# Patient Record
Sex: Female | Born: 1990 | Race: White | Hispanic: No | Marital: Married | Smoking: Current every day smoker
Health system: Southern US, Community
[De-identification: ages and names within clinical notes are randomized; demographics above are authoritative.]

## PROBLEM LIST (undated history)

## (undated) ENCOUNTER — Inpatient Hospital Stay: Payer: Self-pay

## (undated) ENCOUNTER — Emergency Department: Discharge status: Absent without leave | Payer: Medicaid Other

## (undated) ENCOUNTER — Inpatient Hospital Stay: Payer: Self-pay | Admitting: Maternal Newborn

## (undated) DIAGNOSIS — Z87442 Personal history of urinary calculi: Secondary | ICD-10-CM

## (undated) DIAGNOSIS — E119 Type 2 diabetes mellitus without complications: Secondary | ICD-10-CM

## (undated) DIAGNOSIS — J45909 Unspecified asthma, uncomplicated: Secondary | ICD-10-CM

## (undated) HISTORY — PX: NO PAST SURGERIES: SHX2092

## (undated) HISTORY — DX: Unspecified asthma, uncomplicated: J45.909

## (undated) HISTORY — DX: Type 2 diabetes mellitus without complications: E11.9

---
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
## 2012-10-04 ENCOUNTER — Ambulatory Visit: Payer: Self-pay | Admitting: Family Medicine

## 2012-10-04 LAB — PREGNANCY, URINE: Pregnancy Test, Urine: NEGATIVE m[IU]/mL

## 2017-01-23 ENCOUNTER — Encounter: Payer: Self-pay | Admitting: Advanced Practice Midwife

## 2017-01-23 ENCOUNTER — Other Ambulatory Visit: Payer: Self-pay

## 2017-01-23 ENCOUNTER — Ambulatory Visit (INDEPENDENT_AMBULATORY_CARE_PROVIDER_SITE_OTHER): Payer: Medicaid Other | Admitting: Advanced Practice Midwife

## 2017-01-23 VITALS — BP 128/78 | HR 80 | Ht 64.0 in | Wt 215.0 lb

## 2017-01-23 DIAGNOSIS — Z113 Encounter for screening for infections with a predominantly sexual mode of transmission: Secondary | ICD-10-CM

## 2017-01-23 DIAGNOSIS — Z131 Encounter for screening for diabetes mellitus: Secondary | ICD-10-CM

## 2017-01-23 DIAGNOSIS — Z3400 Encounter for supervision of normal first pregnancy, unspecified trimester: Secondary | ICD-10-CM

## 2017-01-23 DIAGNOSIS — O99210 Obesity complicating pregnancy, unspecified trimester: Secondary | ICD-10-CM

## 2017-01-23 DIAGNOSIS — O0990 Supervision of high risk pregnancy, unspecified, unspecified trimester: Secondary | ICD-10-CM | POA: Insufficient documentation

## 2017-01-23 DIAGNOSIS — Z6835 Body mass index (BMI) 35.0-35.9, adult: Secondary | ICD-10-CM

## 2017-01-23 NOTE — Patient Instructions (Signed)
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
## 2017-01-24 ENCOUNTER — Encounter: Payer: Self-pay | Admitting: Emergency Medicine

## 2017-01-24 ENCOUNTER — Inpatient Hospital Stay
Admission: EM | Admit: 2017-01-24 | Discharge: 2017-01-28 | DRG: 832 | Disposition: A | Discharge status: Home / Self Care | Payer: Medicaid Other | Attending: Obstetrics and Gynecology | Admitting: Obstetrics and Gynecology

## 2017-01-24 ENCOUNTER — Encounter: Payer: Self-pay | Admitting: Advanced Practice Midwife

## 2017-01-24 DIAGNOSIS — O30049 Twin pregnancy, dichorionic/diamniotic, unspecified trimester: Secondary | ICD-10-CM

## 2017-01-24 DIAGNOSIS — R1011 Right upper quadrant pain: Secondary | ICD-10-CM

## 2017-01-24 DIAGNOSIS — N10 Acute tubulo-interstitial nephritis: Secondary | ICD-10-CM

## 2017-01-24 DIAGNOSIS — O26891 Other specified pregnancy related conditions, first trimester: Secondary | ICD-10-CM

## 2017-01-24 DIAGNOSIS — N201 Calculus of ureter: Secondary | ICD-10-CM

## 2017-01-24 DIAGNOSIS — T83022A Displacement of nephrostomy catheter, initial encounter: Secondary | ICD-10-CM

## 2017-01-24 DIAGNOSIS — R109 Unspecified abdominal pain: Secondary | ICD-10-CM | POA: Diagnosis present

## 2017-01-24 DIAGNOSIS — F17210 Nicotine dependence, cigarettes, uncomplicated: Secondary | ICD-10-CM | POA: Diagnosis present

## 2017-01-24 DIAGNOSIS — Z803 Family history of malignant neoplasm of breast: Secondary | ICD-10-CM

## 2017-01-24 DIAGNOSIS — N136 Pyonephrosis: Secondary | ICD-10-CM | POA: Diagnosis present

## 2017-01-24 DIAGNOSIS — O30041 Twin pregnancy, dichorionic/diamniotic, first trimester: Secondary | ICD-10-CM | POA: Diagnosis present

## 2017-01-24 DIAGNOSIS — R1031 Right lower quadrant pain: Secondary | ICD-10-CM

## 2017-01-24 DIAGNOSIS — O2301 Infections of kidney in pregnancy, first trimester: Principal | ICD-10-CM | POA: Diagnosis present

## 2017-01-24 DIAGNOSIS — O99331 Smoking (tobacco) complicating pregnancy, first trimester: Secondary | ICD-10-CM | POA: Diagnosis present

## 2017-01-24 DIAGNOSIS — Z3A10 10 weeks gestation of pregnancy: Secondary | ICD-10-CM

## 2017-01-24 LAB — URINALYSIS, COMPLETE (UACMP) WITH MICROSCOPIC
Bilirubin Urine: NEGATIVE
Glucose, UA: NEGATIVE mg/dL
KETONES UR: NEGATIVE mg/dL
Nitrite: POSITIVE — AB
PROTEIN: 100 mg/dL — AB
Specific Gravity, Urine: 1.017 (ref 1.005–1.030)
pH: 5 (ref 5.0–8.0)

## 2017-01-24 LAB — COMPREHENSIVE METABOLIC PANEL
ALBUMIN: 3.9 g/dL (ref 3.5–5.0)
ALT: 23 U/L (ref 14–54)
ANION GAP: 10 (ref 5–15)
AST: 23 U/L (ref 15–41)
Alkaline Phosphatase: 68 U/L (ref 38–126)
BUN: 9 mg/dL (ref 6–20)
CHLORIDE: 104 mmol/L (ref 101–111)
CO2: 20 mmol/L — AB (ref 22–32)
Calcium: 9.4 mg/dL (ref 8.9–10.3)
Creatinine, Ser: 0.5 mg/dL (ref 0.44–1.00)
GFR calc non Af Amer: >60 mL/min (ref >60)
GLUCOSE: 106 mg/dL — AB (ref 65–99)
Potassium: 3.7 mmol/L (ref 3.5–5.1)
SODIUM: 134 mmol/L — AB (ref 135–145)
Total Bilirubin: 0.4 mg/dL (ref 0.3–1.2)
Total Protein: 7.5 g/dL (ref 6.5–8.1)

## 2017-01-24 LAB — CBC
HCT: 39.7 % (ref 35.0–47.0)
HEMOGLOBIN: 13.7 g/dL (ref 12.0–16.0)
MCH: 30.5 pg (ref 26.0–34.0)
MCHC: 34.5 g/dL (ref 32.0–36.0)
MCV: 88.4 fL (ref 80.0–100.0)
Platelets: 373 10*3/uL (ref 150–440)
RBC: 4.49 MIL/uL (ref 3.80–5.20)
RDW: 13.2 % (ref 11.5–14.5)
WBC: 15.2 10*3/uL — ABNORMAL HIGH (ref 3.6–11.0)

## 2017-01-24 LAB — LIPASE, BLOOD: LIPASE: 22 U/L (ref 11–51)

## 2017-01-24 LAB — HCG, QUANTITATIVE, PREGNANCY: HCG, BETA CHAIN, QUANT, S: 223491 m[IU]/mL — AB (ref <5)

## 2017-01-24 MED ORDER — PROMETHAZINE HCL 25 MG/ML IJ SOLN
12.5000 mg | Freq: Once | INTRAMUSCULAR | Status: AC
  Administered 2017-01-24: 12.5 mg via INTRAVENOUS

## 2017-01-24 MED ORDER — MORPHINE SULFATE (PF) 2 MG/ML IV SOLN
2.0000 mg | Freq: Once | INTRAVENOUS | Status: AC
  Administered 2017-01-24: 2 mg via INTRAVENOUS

## 2017-01-24 MED ORDER — MORPHINE SULFATE (PF) 2 MG/ML IV SOLN
INTRAVENOUS | Status: AC
Start: 2017-01-24 — End: 2017-01-25
  Filled 2017-01-24: qty 1

## 2017-01-24 MED ORDER — PROMETHAZINE HCL 25 MG/ML IJ SOLN
INTRAMUSCULAR | Status: AC
  Filled 2017-01-24: qty 1

## 2017-01-24 NOTE — ED Triage Notes (Signed)
Pt comes into the ED via POV c/o right RLQ abdominal pain that wraps around to her back.  Patient is [redacted] weeks pregnant.  Denies any vaginal bleeding at this time.  Abdominal pain started yesterday but today it has created nausea and increased pain.  Patient has even and unlabored respirations at this time and ambulatory to triage. Patient states she does have a h/o kidney stones.

## 2017-01-25 ENCOUNTER — Other Ambulatory Visit: Payer: Self-pay | Admitting: Maternal Newborn

## 2017-01-25 ENCOUNTER — Emergency Department: Payer: Medicaid Other

## 2017-01-25 ENCOUNTER — Observation Stay
Admit: 2017-01-25 | Discharge: 2017-01-25 | Disposition: A | Discharge status: Home / Self Care | Payer: Medicaid Other | Attending: Interventional Radiology | Admitting: Interventional Radiology

## 2017-01-25 ENCOUNTER — Observation Stay: Payer: Medicaid Other

## 2017-01-25 ENCOUNTER — Other Ambulatory Visit: Payer: Self-pay

## 2017-01-25 DIAGNOSIS — O30049 Twin pregnancy, dichorionic/diamniotic, unspecified trimester: Secondary | ICD-10-CM

## 2017-01-25 DIAGNOSIS — O2301 Infections of kidney in pregnancy, first trimester: Secondary | ICD-10-CM

## 2017-01-25 DIAGNOSIS — N201 Calculus of ureter: Secondary | ICD-10-CM

## 2017-01-25 DIAGNOSIS — O30041 Twin pregnancy, dichorionic/diamniotic, first trimester: Secondary | ICD-10-CM

## 2017-01-25 DIAGNOSIS — Z3A10 10 weeks gestation of pregnancy: Secondary | ICD-10-CM | POA: Diagnosis not present

## 2017-01-25 DIAGNOSIS — N136 Pyonephrosis: Secondary | ICD-10-CM

## 2017-01-25 HISTORY — PX: IR NEPHROSTOMY PLACEMENT RIGHT: IMG6064

## 2017-01-25 LAB — CHLAMYDIA/GONOCOCCUS/TRICHOMONAS, NAA
CHLAMYDIA BY NAA: NEGATIVE
Gonococcus by NAA: NEGATIVE
Trich vag by NAA: NEGATIVE

## 2017-01-25 LAB — URINE CULTURE: Organism ID, Bacteria: NO GROWTH

## 2017-01-25 MED ORDER — IOPAMIDOL (ISOVUE-300) INJECTION 61%
10.0000 mL | Freq: Once | INTRAVENOUS | Status: AC | PRN
  Administered 2017-01-25: 20 mL via INTRAVENOUS

## 2017-01-25 MED ORDER — PRENATAL MULTIVITAMIN CH
1.0000 | ORAL_TABLET | Freq: Every day | ORAL | Status: DC
  Filled 2017-01-25 (×3): qty 1

## 2017-01-25 MED ORDER — SODIUM CHLORIDE 0.9% FLUSH: 9.0000 mL | INTRAVENOUS | Status: DC | PRN

## 2017-01-25 MED ORDER — MORPHINE SULFATE (PF) 2 MG/ML IV SOLN
2.0000 mg | INTRAVENOUS | Status: DC | PRN
  Administered 2017-01-25 (×3): 2 mg via INTRAVENOUS
  Filled 2017-01-25 (×2): qty 1

## 2017-01-25 MED ORDER — FENTANYL CITRATE (PF) 100 MCG/2ML IJ SOLN
INTRAMUSCULAR | Status: AC
  Filled 2017-01-25: qty 2

## 2017-01-25 MED ORDER — ACETAMINOPHEN 325 MG PO TABS: 650.0000 mg | ORAL_TABLET | ORAL | Status: DC | PRN

## 2017-01-25 MED ORDER — MORPHINE SULFATE (PF) 2 MG/ML IV SOLN
2.0000 mg | Freq: Once | INTRAVENOUS | Status: AC
  Administered 2017-01-25: 2 mg via INTRAVENOUS
  Filled 2017-01-25: qty 1

## 2017-01-25 MED ORDER — DEXTROSE 5 % IV SOLN
1.0000 g | Freq: Two times a day (BID) | INTRAVENOUS | Status: DC
  Administered 2017-01-25 – 2017-01-26 (×2): 1 g via INTRAVENOUS
  Filled 2017-01-25 (×4): qty 10

## 2017-01-25 MED ORDER — NICOTINE 21 MG/24HR TD PT24
21.0000 mg | MEDICATED_PATCH | Freq: Every day | TRANSDERMAL | Status: DC
  Administered 2017-01-25: 21 mg via TRANSDERMAL
  Filled 2017-01-25 (×2): qty 1

## 2017-01-25 MED ORDER — CLINDAMYCIN PHOSPHATE 600 MG/50ML IV SOLN
600.0000 mg | Freq: Once | INTRAVENOUS | Status: AC
  Administered 2017-01-25: 600 mg via INTRAVENOUS
  Filled 2017-01-25: qty 50

## 2017-01-25 MED ORDER — ZOLPIDEM TARTRATE 5 MG PO TABS
5.0000 mg | ORAL_TABLET | Freq: Every evening | ORAL | Status: DC | PRN
  Administered 2017-01-25 – 2017-01-27 (×3): 5 mg via ORAL
  Filled 2017-01-25 (×3): qty 1

## 2017-01-25 MED ORDER — LIDOCAINE HCL (PF) 1 % IJ SOLN
INTRAMUSCULAR | Status: AC | PRN
  Administered 2017-01-25: 10 mL

## 2017-01-25 MED ORDER — ONDANSETRON HCL 4 MG/2ML IJ SOLN: 4.0000 mg | Freq: Four times a day (QID) | INTRAMUSCULAR | Status: DC | PRN

## 2017-01-25 MED ORDER — FENTANYL CITRATE (PF) 100 MCG/2ML IJ SOLN
25.0000 ug | Freq: Once | INTRAMUSCULAR | Status: AC
  Administered 2017-01-25: 25 ug via INTRAVENOUS

## 2017-01-25 MED ORDER — MORPHINE SULFATE (PF) 2 MG/ML IV SOLN
INTRAVENOUS | Status: AC
  Filled 2017-01-25: qty 1

## 2017-01-25 MED ORDER — SODIUM CHLORIDE 0.9 % IJ SOLN
INTRAMUSCULAR | Status: AC
  Filled 2017-01-25: qty 10

## 2017-01-25 MED ORDER — SODIUM CHLORIDE 0.9 % IV SOLN
INTRAVENOUS | Status: DC
  Administered 2017-01-25: 13:00:00 via INTRAVENOUS

## 2017-01-25 MED ORDER — DIPHENHYDRAMINE HCL 12.5 MG/5ML PO ELIX
12.5000 mg | ORAL_SOLUTION | Freq: Four times a day (QID) | ORAL | Status: DC | PRN
  Filled 2017-01-25: qty 5

## 2017-01-25 MED ORDER — MORPHINE SULFATE 2 MG/ML IV SOLN
INTRAVENOUS | Status: DC
  Administered 2017-01-25: 17:00:00 via INTRAVENOUS
  Administered 2017-01-26 (×3): 2 mg via INTRAVENOUS
  Administered 2017-01-26: 14 mg via INTRAVENOUS
  Administered 2017-01-26: 19 mg via INTRAVENOUS
  Filled 2017-01-25 (×2): qty 25

## 2017-01-25 MED ORDER — SODIUM CHLORIDE FLUSH 0.9 % IV SOLN
INTRAVENOUS | Status: AC
  Filled 2017-01-25: qty 20

## 2017-01-25 MED ORDER — NALOXONE HCL 0.4 MG/ML IJ SOLN: 0.4000 mg | INTRAMUSCULAR | Status: DC | PRN

## 2017-01-25 MED ORDER — CEFTRIAXONE SODIUM IN DEXTROSE 20 MG/ML IV SOLN
1.0000 g | Freq: Once | INTRAVENOUS | Status: AC
  Administered 2017-01-25: 1 g via INTRAVENOUS
  Filled 2017-01-25: qty 50

## 2017-01-25 MED ORDER — DIPHENHYDRAMINE HCL 50 MG/ML IJ SOLN
12.5000 mg | Freq: Four times a day (QID) | INTRAMUSCULAR | Status: DC | PRN
  Administered 2017-01-26 – 2017-01-27 (×4): 12.5 mg via INTRAVENOUS
  Filled 2017-01-25 (×4): qty 1

## 2017-01-25 MED ORDER — SODIUM CHLORIDE 0.9% FLUSH
5.0000 mL | Freq: Three times a day (TID) | INTRAVENOUS | Status: DC
  Administered 2017-01-25 – 2017-01-27 (×3): 5 mL via INTRAVENOUS

## 2017-01-25 MED ORDER — ACETAMINOPHEN 325 MG PO TABS
650.0000 mg | ORAL_TABLET | ORAL | Status: DC | PRN
  Administered 2017-01-27 – 2017-01-28 (×3): 650 mg via ORAL
  Filled 2017-01-25 (×4): qty 2

## 2017-01-25 MED ORDER — HYDROMORPHONE HCL 1 MG/ML IJ SOLN
1.0000 mg | Freq: Once | INTRAMUSCULAR | Status: AC
  Administered 2017-01-25: 1 mg via INTRAVENOUS
  Filled 2017-01-25: qty 1

## 2017-01-25 MED ORDER — FENTANYL CITRATE (PF) 100 MCG/2ML IJ SOLN
INTRAMUSCULAR | Status: AC | PRN
  Administered 2017-01-25 (×2): 12.5 ug via INTRAVENOUS
  Administered 2017-01-25: 25 ug via INTRAVENOUS
  Administered 2017-01-25 (×3): 12.5 ug via INTRAVENOUS
  Administered 2017-01-25: 25 ug via INTRAVENOUS
  Administered 2017-01-25 (×2): 12.5 ug via INTRAVENOUS

## 2017-01-25 NOTE — Procedures (Signed)
Interventional Radiology Procedure Note  Procedure: Right percutaneous nephrostomy tube placement  Complications: None  Estimated Blood Loss: 25 mL  Findings: Difficult right renal access due to relatively high kidney and rib shadowing by US.  10 Fr PCN able to be placed.  Difficult to advance and not fully formed, but draining bloody urine. Attached to gravity bag.  Jodi MarbleGlenn T. Fredia SorrowYamagata, M.D Pager:  [phone]
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
## 2017-01-25 NOTE — ED Notes (Signed)
Ultrasound pictures prints for patient per Dr. Manson PasseyBrown
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
## 2017-01-25 NOTE — ED Notes (Signed)
Pt states she has been having abdominal pain for the past three days.  Pt is [redacted] weeks pregnant. Family at bedside.
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
## 2017-01-26 ENCOUNTER — Telehealth: Payer: Self-pay | Admitting: Urology

## 2017-01-26 ENCOUNTER — Encounter: Payer: Self-pay | Admitting: Interventional Radiology

## 2017-01-26 DIAGNOSIS — Z3A10 10 weeks gestation of pregnancy: Secondary | ICD-10-CM

## 2017-01-26 DIAGNOSIS — N201 Calculus of ureter: Secondary | ICD-10-CM | POA: Diagnosis not present

## 2017-01-26 DIAGNOSIS — O2301 Infections of kidney in pregnancy, first trimester: Secondary | ICD-10-CM | POA: Diagnosis not present

## 2017-01-26 DIAGNOSIS — O30041 Twin pregnancy, dichorionic/diamniotic, first trimester: Secondary | ICD-10-CM

## 2017-01-26 LAB — BASIC METABOLIC PANEL
ANION GAP: 5 (ref 5–15)
BUN: 9 mg/dL (ref 6–20)
CALCIUM: 9.2 mg/dL (ref 8.9–10.3)
CO2: 23 mmol/L (ref 22–32)
Chloride: 103 mmol/L (ref 101–111)
Creatinine, Ser: 0.47 mg/dL (ref 0.44–1.00)
GFR calc non Af Amer: >60 mL/min (ref >60)
Glucose, Bld: 98 mg/dL (ref 65–99)
POTASSIUM: 4 mmol/L (ref 3.5–5.1)
Sodium: 131 mmol/L — ABNORMAL LOW (ref 135–145)

## 2017-01-26 LAB — CBC
HEMATOCRIT: 35.9 % (ref 35.0–47.0)
HEMOGLOBIN: 12.3 g/dL (ref 12.0–16.0)
MCH: 30.5 pg (ref 26.0–34.0)
MCHC: 34.2 g/dL (ref 32.0–36.0)
MCV: 89.2 fL (ref 80.0–100.0)
Platelets: 308 10*3/uL (ref 150–440)
RBC: 4.02 MIL/uL (ref 3.80–5.20)
RDW: 13.5 % (ref 11.5–14.5)
WBC: 13.4 10*3/uL — AB (ref 3.6–11.0)

## 2017-01-26 MED ORDER — SENNA 8.6 MG PO TABS
2.0000 | ORAL_TABLET | Freq: Every day | ORAL | Status: DC
  Filled 2017-01-26: qty 2

## 2017-01-26 MED ORDER — MORPHINE SULFATE 2 MG/ML IV SOLN
INTRAVENOUS | Status: DC
  Administered 2017-01-26: 18:00:00 via INTRAVENOUS
  Administered 2017-01-26 – 2017-01-27 (×2): 15 mg via INTRAVENOUS
  Administered 2017-01-27: 39 mg via INTRAVENOUS
  Administered 2017-01-27: 13:00:00 via INTRAVENOUS
  Administered 2017-01-27: 16 mg via INTRAVENOUS
  Administered 2017-01-27: 01:00:00 via INTRAVENOUS
  Administered 2017-01-27: 23 mg via INTRAVENOUS
  Administered 2017-01-27: 12.5 mL via INTRAVENOUS
  Filled 2017-01-26: qty 25
  Filled 2017-01-26 (×3): qty 30
  Filled 2017-01-26: qty 25

## 2017-01-26 MED ORDER — SENNOSIDES-DOCUSATE SODIUM 8.6-50 MG PO TABS
2.0000 | ORAL_TABLET | Freq: Every day | ORAL | Status: DC
  Administered 2017-01-27 (×2): 2 via ORAL
  Filled 2017-01-26 (×2): qty 2

## 2017-01-26 MED ORDER — SENNA 8.6 MG PO TABS: 2.0000 | ORAL_TABLET | Freq: Every day | ORAL | Status: DC

## 2017-01-26 MED ORDER — CEFIXIME 400 MG PO CAPS
400.0000 mg | ORAL_CAPSULE | Freq: Every day | ORAL | Status: DC
  Administered 2017-01-26 – 2017-01-28 (×3): 400 mg via ORAL
  Filled 2017-01-26 (×4): qty 1

## 2017-01-26 MED ORDER — OXYCODONE-ACETAMINOPHEN 5-325 MG PO TABS
1.0000 | ORAL_TABLET | Freq: Four times a day (QID) | ORAL | Status: DC | PRN
  Administered 2017-01-26: 2 via ORAL
  Filled 2017-01-26: qty 2

## 2017-01-26 NOTE — Telephone Encounter (Signed)
App made and mailed to patient ° °Courtney Burnett °

## 2017-01-26 NOTE — Progress Notes (Signed)
PAD #1. POD #1 from right percutaneous nephrostomy  Placement for 1.4 cm right renal stone at UPJ. Subjective:   Discontinued morphine PCA earlier today and given Percocet for pain, however Percocet not effective in relieving pain. Has been eating regular diet and voiding without difficulty. Seen by Urology who have released her. To follow up in 2 weeks as outpatient.   Objective:  Blood pressure (!) 119/59, pulse 77, temperature 98.6 F (37 C), temperature source Oral, resp. rate 20, height _0  (1.626 m), weight 97.5 kg (215 lb), last menstrual period 11/13/2016, SpO2 98 %.  General: moaning with pain, lying on left side Pulmonary: no increased work of breathing/ Lungs CTA Heart: RRR without murmur Extremities: no edema, no erythema, no tenderness Nephrostomy site dry, urine draining from nephrostomy now appears clear/ urine is bloody in nephrostomy bag. Urine culture from office 12/11 was no growth  Results for orders placed or performed during the hospital encounter of 01/24/17 (from the past 72 hour(s))  Lipase, blood     Status: None   Collection Time: 01/24/17  9:18 PM  Result Value Ref Range   Lipase 22 11 - 51 U/L  Comprehensive metabolic panel     Status: Abnormal   Collection Time: 01/24/17  9:18 PM  Result Value Ref Range   Sodium 134 (L) 135 - 145 mmol/L   Potassium 3.7 3.5 - 5.1 mmol/L   Chloride 104 101 - 111 mmol/L   CO2 20 (L) 22 - 32 mmol/L   Glucose, Bld 106 (H) 65 - 99 mg/dL   BUN 9 6 - 20 mg/dL   Creatinine, Ser 0.50 0.44 - 1.00 mg/dL   Calcium 9.4 8.9 - 10.3 mg/dL   Total Protein 7.5 6.5 - 8.1 g/dL   Albumin 3.9 3.5 - 5.0 g/dL   AST 23 15 - 41 U/L   ALT 23 14 - 54 U/L   Alkaline Phosphatase 68 38 - 126 U/L   Total Bilirubin 0.4 0.3 - 1.2 mg/dL   GFR calc non Af Amer >60 >60 mL/min   GFR calc Af Amer >60 >60 mL/min    Comment: (NOTE) The eGFR has been calculated using the CKD EPI equation. This calculation has not been validated in all clinical  situations. eGFR's persistently <60 mL/min signify possible Chronic Kidney Disease.    Anion gap 10 5 - 15  CBC     Status: Abnormal   Collection Time: 01/24/17  9:18 PM  Result Value Ref Range   WBC 15.2 (H) 3.6 - 11.0 K/uL   RBC 4.49 3.80 - 5.20 MIL/uL   Hemoglobin 13.7 12.0 - 16.0 g/dL   HCT 39.7 35.0 - 47.0 %   MCV 88.4 80.0 - 100.0 fL   MCH 30.5 26.0 - 34.0 pg   MCHC 34.5 32.0 - 36.0 g/dL   RDW 13.2 11.5 - 14.5 %   Platelets 373 150 - 440 K/uL  hCG, quantitative, pregnancy     Status: Abnormal   Collection Time: 01/24/17  9:18 PM  Result Value Ref Range   hCG, Beta Chain, Quant, S 223,491 (H) <5 mIU/mL    Comment:          GEST. AGE      CONC.  (mIU/mL)   <=1 WEEK        5 - 50     2 WEEKS       50 - 500     3 WEEKS       100 -  10,000     4 WEEKS     1,000 - 30,000     5 WEEKS     3,500 - 115,000   6-8 WEEKS     12,000 - 270,000    12 WEEKS     15,000 - 220,000        FEMALE AND NON-PREGNANT FEMALE:     LESS THAN 5 mIU/mL   Urinalysis, Complete w Microscopic     Status: Abnormal   Collection Time: 01/24/17  9:19 PM  Result Value Ref Range   Color, Urine YELLOW (A) YELLOW   APPearance CLOUDY (A) CLEAR   Specific Gravity, Urine 1.017 1.005 - 1.030   pH 5.0 5.0 - 8.0   Glucose, UA NEGATIVE NEGATIVE mg/dL   Hgb urine dipstick MODERATE (A) NEGATIVE   Bilirubin Urine NEGATIVE NEGATIVE   Ketones, ur NEGATIVE NEGATIVE mg/dL   Protein, ur 100 (A) NEGATIVE mg/dL   Nitrite POSITIVE (A) NEGATIVE   Leukocytes, UA MODERATE (A) NEGATIVE   RBC / HPF 6-30 0 - 5 RBC/hpf   WBC, UA TOO NUMEROUS TO COUNT 0 - 5 WBC/hpf   Bacteria, UA RARE (A) NONE SEEN   Squamous Epithelial / LPF 6-30 (A) NONE SEEN   Mucus PRESENT   CBC     Status: Abnormal   Collection Time: 01/26/17  4:30 AM  Result Value Ref Range   WBC 13.4 (H) 3.6 - 11.0 K/uL   RBC 4.02 3.80 - 5.20 MIL/uL   Hemoglobin 12.3 12.0 - 16.0 g/dL   HCT 35.9 35.0 - 47.0 %   MCV 89.2 80.0 - 100.0 fL   MCH 30.5 26.0 - 34.0 pg    MCHC 34.2 32.0 - 36.0 g/dL   RDW 13.5 11.5 - 14.5 %   Platelets 308 150 - 440 K/uL  Basic metabolic panel     Status: Abnormal   Collection Time: 01/26/17  4:30 AM  Result Value Ref Range   Sodium 131 (L) 135 - 145 mmol/L   Potassium 4.0 3.5 - 5.1 mmol/L   Chloride 103 101 - 111 mmol/L   CO2 23 22 - 32 mmol/L   Glucose, Bld 98 65 - 99 mg/dL   BUN 9 6 - 20 mg/dL   Creatinine, Ser 0.47 0.44 - 1.00 mg/dL   Calcium 9.2 8.9 - 10.3 mg/dL   GFR calc non Af Amer >60 >60 mL/min   GFR calc Af Amer >60 >60 mL/min    Comment: (NOTE) The eGFR has been calculated using the CKD EPI equation. This calculation has not been validated in all clinical situations. eGFR's persistently <60 mL/min signify possible Chronic Kidney Disease.    Anion gap 5 5 - 15     Assessment/Plan   26 y.o. G1P0 postoperative day # 1 s/p percutaneous nephrostomy placement on right for renal stone at UPJ. Poor pain control on po Percocet  Will resume morphine PCa for today/overnight and try roxicodone in Am q4 hours initially Presumed UTI (positive nitrite and TNTC WBCs, but negative urine culture)-currently on Rocephin. Urology recommends keeping on antibiotic x 1 week. Will switch from Rocephin to Suprax 400 mgm daily  Twin gestation at [address], [place]

## 2017-01-26 NOTE — Telephone Encounter (Signed)
-----   Message from Vanna ScotlandAshley Brandon, MD sent at 01/26/2017  8:39 AM EST ----- Regarding: f/u 2-3 weeks with me This is currently an inpatient who had a nephrostomy tube placed for an obstructing stone.  I would like to see her after the holidays to discuss definitive management of her stone.  Vanna ScotlandAshley Brandon, MD

## 2017-01-26 NOTE — Progress Notes (Signed)
Referring Physician(s): Vanna ScotlandBrandon, Ashley  Supervising Physician: Irish LackYamagata, Glenn  Patient Status:  [facility]  Chief Complaint: Renal calculous causing hydronephrosis  HPI:  Courtney Burnett is a 26 y.o. female who is [redacted] weeks pregnant with a twin gestation who presented with an obstructing calculus in the right renal pelvis estimated to be roughly 14 mm in diameter by ultrasound and causing hydronephrosis.    Dr. Apolinar JunesBrandon recommended decompression with a nephrostomy tube for now until at least the second trimester.  She underwent placement of a right nephrostomy tube yesterday by Dr. Fredia SorrowYamagata.  Subjective:  Courtney Burnett is sitting on the side of the bed. She is comfortable. She says the PCN site is a little sore. She is feeling better overall.  Allergies: Patient has no known allergies.  Medications: Prior to Admission medications   Medication Sig Start Date End Date Taking? Authorizing Provider  Prenatal Vit-Fe Fumarate-FA (MULTIVITAMIN-PRENATAL) 27-0.8 MG TABS tablet Take 1 tablet by mouth daily at 12 noon.    [provider]     Vital Signs: BP 129/68 (BP Location: Right Arm)   Pulse 75   Temp 98.6 F (37 C) (Oral)   Resp 19   Ht 5\' 4"  (1.626 m)   Wt 215 lb (97.5 kg)   LMP 11/13/2016 Comment: signed appropriate consent  SpO2 95%   BMI 36.90 kg/m   Physical Exam Awake and alert Sitting on side of bed NAD Right PCN in place. Urine clearing up nicely, still blood tinged but greatly improved from yesterday. ~530 mL output in the PCN bag.  Imaging: [place] Ob Comp Less 14 Wks  Result Date: 01/25/2017 CLINICAL DATA:  Initial evaluation for acute right flank and right lower quadrant pain. EXAM: TWIN OBSTETRIC <14WK  OB US COMPARISON:  None. FINDINGS: Number of IUPs:  2 Chorionicity/Amnionicity:  Dichorionic/diamniotic. TWIN 1 Yolk sac:  Present Embryo:  Present Cardiac Activity: Present Heart Rate: 168 bpm CRL:  34.3  mm   10 w 2 d                  US EDC:  08/21/2017 TWIN 2 Yolk sac:  Present Embryo:  Present Cardiac Activity: Present Heart Rate: 162 bpm CRL:  38.8  mm   10 w 5 d                  US EDC: 08/18/2017 Subchorionic hemorrhage:  None visualized. Maternal uterus/adnexae: Ovaries are normal in appearance bilaterally. No adnexal mass or other abnormality. No free fluid. IMPRESSION: 1. Twin intrauterine gestation as above, estimated gestational age [redacted] weeks and 2 days for twin A, and 10 weeks and 5 days for twin B. no complication. 2. No other acute maternal uterine or adnexal abnormality identified. Electronically Signed   By: Rise MuBenjamin  McClintock M.D.   On: 01/25/2017 03:08   [place] Ob Comp Addl Gest Less 14 Wks  Result Date: 01/25/2017 CLINICAL DATA:  Initial evaluation for acute right flank and right lower quadrant pain. EXAM: TWIN OBSTETRIC <14WK  OB US COMPARISON:  None. FINDINGS: Number of IUPs:  2 Chorionicity/Amnionicity:  Dichorionic/diamniotic. TWIN 1 Yolk sac:  Present Embryo:  Present Cardiac Activity: Present Heart Rate: 168 bpm CRL:  34.3  mm   10 w 2 d                  US EDC: 08/21/2017 TWIN 2 Yolk sac:  Present Embryo:  Present Cardiac Activity: Present Heart Rate: 162 bpm CRL:  38.8  mm   10 w 5 d                  US EDC: 08/18/2017 Subchorionic hemorrhage:  None visualized. Maternal uterus/adnexae: Ovaries are normal in appearance bilaterally. No adnexal mass or other abnormality. No free fluid. IMPRESSION: 1. Twin intrauterine gestation as above, estimated gestational age [redacted] weeks and 2 days for twin A, and 10 weeks and 5 days for twin B. no complication. 2. No other acute maternal uterine or adnexal abnormality identified. Electronically Signed   By: Rise MuBenjamin  McClintock M.D.   On: 01/25/2017 03:08   [place] Intraoperative  Result Date: 01/25/2017 CLINICAL DATA:  Ultrasound was provided for use by the ordering physician, and a technical charge was applied by the performing facility.  No radiologist interpretation/professional services  rendered.   [place] Renal  Result Date: 01/25/2017 CLINICAL DATA:  Initial evaluation for acute right flank and right lower quadrant pain for 3 days. EXAM: RENAL / URINARY TRACT ULTRASOUND COMPLETE COMPARISON:  Prior CT from 01/08/2007. FINDINGS: Right Kidney: Length: 12.8 cm. Mildly increased echogenicity within the renal parenchyma. No mass lesion. There is an obstructive 14 mm obstructive calculus positioned at the right UPJ with secondary mild right hydronephrosis. Left Kidney: Length: 12.7 cm. Echogenicity within normal limits. No mass or hydronephrosis visualized. Bladder: Appears normal for degree of bladder distention. Bilateral ureteral jets were visualized. IMPRESSION: 1. 14 mm obstructive stone at the right UPJ with secondary mild right hydronephrosis. 2. Normal sonographic appearance of the left kidney. Electronically Signed   By: Rise MuBenjamin  McClintock M.D.   On: 01/25/2017 01:43   Ir Nephrostomy Placement Right  Result Date: 01/26/2017 INDICATION: Right hydronephrosis and urinary infection secondary to pelvic renal calculus. Nephrostomy tube placement requested due to current first trimester pregnancy and need to wait until at least the second trimester to address the renal calculus. EXAM: IR NEPHROSTOMY PLACEMENT RIGHT COMPARISON:  Renal ultrasound on 01/25/2017 MEDICATIONS: Clindamycin 600 mg IV; The antibiotic was administered in an appropriate time frame prior to skin puncture. ANESTHESIA/SEDATION: Fentanyl 175 mcg IV Moderate Sedation Time:  43 minutes. The patient was continuously monitored during the procedure by the interventional radiology nurse under my direct supervision. CONTRAST:  20mL ISOVUE-300 IOPAMIDOL (ISOVUE-300) INJECTION 61% - administered into the collecting system(s) FLUOROSCOPY TIME:  Fluoroscopy Time: 3 minutes and 6 seconds. The patient's pelvis was wrapped fully with a lead apron during the procedure due to current pregnancy. COMPLICATIONS: None immediate. PROCEDURE:  Informed written consent was obtained from the patient after a thorough discussion of the procedural risks, benefits and alternatives. All questions were addressed. Maximal Sterile Barrier Technique was utilized including caps, mask, sterile gowns, sterile gloves, sterile drape, hand hygiene and skin antiseptic. A timeout was performed prior to the initiation of the procedure. Under ultrasound guidance, a 21 gauge needle was advanced into the posterior lower pole collecting system of the right kidney. After return of urine, diluted contrast was injected into the collecting system. A guidewire was advanced. A transitional dilator was placed. The percutaneous tract was dilated to 10 [place] and a 10 [place] nephrostomy tube advanced. The catheter was secured at the skin with a Prolene retention suture and StatLock device. It was flushed and connected to a gravity drainage bag. FINDINGS: Ultrasound shows mild to moderate hydronephrosis. A calculus is visible under fluoroscopy at the UPJ. The kidney was relatively high and access was difficult under ultrasound. Ultimately, the nephrostomy tube was able to be advanced into the renal pelvis. It  was not fully formed as there was some trouble advancing the tube over a guidewire. Urine return is initially bloody after tube placement. IMPRESSION: Placement of 10 French percutaneous nephrostomy tube in right kidney advanced to the level of the renal pelvis. This will be left to gravity bag drainage. Electronically Signed   By: Irish Lack M.D.   On: 01/26/2017 09:17   US Abdomen Limited Ruq  Result Date: 01/25/2017 CLINICAL DATA:  Acute onset of right flank pain. EXAM: ULTRASOUND ABDOMEN LIMITED RIGHT UPPER QUADRANT COMPARISON:  CT of the abdomen and pelvis performed 01/08/2007 FINDINGS: Gallbladder: No gallstones or wall thickening visualized. No sonographic Murphy sign noted by sonographer. Common bile duct: Diameter: 0.4 cm, within normal limits in caliber. Liver: No  focal lesion identified. Within normal limits in parenchymal echogenicity. Portal vein is patent on color Doppler imaging with normal direction of blood flow towards the liver. IMPRESSION: Unremarkable ultrasound of the right upper quadrant. Electronically Signed   By: Roanna Raider M.D.   On: 01/25/2017 01:54    Labs:  CBC: Recent Labs    01/24/17 2118 01/26/17 0430  WBC 15.2* 13.4*  HGB 13.7 12.3  HCT 39.7 35.9  PLT 373 308    COAGS: No results for input(s): INR, APTT in the last 8760 hours.  BMP: Recent Labs    01/24/17 2118 01/26/17 0430  NA 134* 131*  K 3.7 4.0  CL 104 103  CO2 20* 23  GLUCOSE 106* 98  BUN 9 9  CALCIUM 9.4 9.2  CREATININE 0.50 0.47  GFRNONAA >60 >60  GFRAA >60 >60    LIVER FUNCTION TESTS: Recent Labs    01/24/17 2118  BILITOT 0.4  AST 23  ALT 23  ALKPHOS 68  PROT 7.5  ALBUMIN 3.9    Assessment and Plan:  [redacted] weeks pregnant with a twin gestation who presented with an obstructing calculus in the right renal pelvis estimated to be roughly 14 mm in diameter by ultrasound and causing hydronephrosis.    Needed decompression with a nephrostomy tube for now until at least the second trimester.  S/P right nephrostomy tube 01/25/2017 by Dr. Fredia Sorrow.  Continue routine PCN care.  Electronically Signed: Gwynneth Macleod, PA-C 01/26/2017, 9:58 AM   I spent a total of 15 Minutes at the the patient's bedside AND on the patient's hospital floor or unit, greater than 50% of which was counseling/coordinating care for f/u after PCN placement

## 2017-01-26 NOTE — Progress Notes (Signed)
Urology Consult Follow Up  Subjective: Initially was significant discomfort yesterday, dramatically improved this a.m.  Sitting at bedside, ordering breakfast.  Nephrostomy tube draining well.  No fevers.  Anti-infectives: Anti-infectives (From admission, onward)   Start     Dose/Rate Route Frequency Ordered Stop   01/25/17 2000  cefTRIAXone (ROCEPHIN) 1 g in dextrose 5 % 50 mL IVPB     1 g 100 mL/hr over 30 Minutes Intravenous Every 12 hours 01/25/17 1909     01/25/17 1215  clindamycin (CLEOCIN) IVPB 600 mg     600 mg 100 mL/hr over 30 Minutes Intravenous  Once 01/25/17 1207 01/25/17 1511   01/25/17 0230  cefTRIAXone (ROCEPHIN) 1 g in dextrose 5 % 50 mL IVPB - Premix     1 g 100 mL/hr over 30 Minutes Intravenous  Once 01/25/17 0229 01/25/17 0335      Current Facility-Administered Medications  Medication Dose Route Frequency Provider Last Rate Last Dose  . 0.9 %  sodium chloride infusion   Intravenous Continuous Irish LackYamagata, Glenn, MD 10 mL/hr at 01/25/17 1305    . acetaminophen (TYLENOL) tablet 650 mg  650 mg Oral Q4H PRN Schuman, Christanna R, MD      . cefTRIAXone (ROCEPHIN) 1 g in dextrose 5 % 50 mL IVPB  1 g Intravenous Q12H Vena AustriaStaebler, Andreas, MD   Stopped at 01/25/17 2040  . diphenhydrAMINE (BENADRYL) injection 12.5 mg  12.5 mg Intravenous Q6H PRN Vena AustriaStaebler, Andreas, MD   12.5 mg at 01/26/17 0406   Or  . diphenhydrAMINE (BENADRYL) 12.5 MG/5ML elixir 12.5 mg  12.5 mg Oral Q6H PRN Vena AustriaStaebler, Andreas, MD      . morphine 2 mg/mL PCA injection   Intravenous Q4H Vena AustriaStaebler, Andreas, MD   2 mg at 01/26/17 0123  . naloxone [facility](NARCAN) injection 0.4 mg  0.4 mg Intravenous PRN Vena AustriaStaebler, Andreas, MD       And  . sodium chloride flush (NS) 0.9 % injection 9 mL  9 mL Intravenous PRN Vena AustriaStaebler, Andreas, MD      . nicotine (NICODERM CQ - dosed in mg/24 hours) patch 21 mg  21 mg Transdermal Daily Oswaldo ConroySchmid, Jacelyn Y, CNM   21 mg at 01/25/17 0901  . ondansetron (ZOFRAN) injection 4 mg  4 mg Intravenous Q6H  PRN Oswaldo ConroySchmid, Jacelyn Y, CNM      . prenatal multivitamin tablet 1 tablet  1 tablet Oral Q1200 Natale MilchSchuman, Christanna R, MD   Stopped at 01/25/17 1155  . sodium chloride flush (NS) 0.9 % injection 5 mL  5 mL Intravenous Q8H Irish LackYamagata, Glenn, MD   5 mL at 01/25/17 1720  . zolpidem (AMBIEN) tablet 5 mg  5 mg Oral QHS PRN Adelene IdlerSchuman, Christanna R, MD   5 mg at 01/25/17 2008     Objective: Vital signs in last 24 hours: Temp:  [97.8 F (36.6 C)-98.6 F (37 C)] 98.6 F (37 C) (12/14 0754) Pulse Rate:  [75-102] 75 (12/14 0754) Resp:  [14-42] 18 (12/14 0307) BP: (102-236)/(63-122) 129/68 (12/14 0754) SpO2:  [94 %-99 %] 99 % (12/14 0754)  Intake/Output from previous day: 12/13 0701 - 12/14 0700 In: 160 [I.V.:100; IV Piggyback:50] Out: 980 [Urine:980] Intake/Output this shift: Total I/O In: -  Out: 800 [Urine:800]   Physical Exam  Alert and oriented.  Sitting at bedside.  Husband present. Abdomen soft, obese, nontender. Right percutaneous nephrostomy tube in excellent position, draining pink tinged urine.  Lab Results:  Recent Labs    01/24/17 2118 01/26/17 0430  WBC 15.2* 13.4*  HGB 13.7 12.3  HCT 39.7 35.9  PLT 373 308   BMET Recent Labs    01/24/17 2118 01/26/17 0430  NA 134* 131*  K 3.7 4.0  CL 104 103  CO2 20* 23  GLUCOSE 106* 98  BUN 9 9  CREATININE 0.50 0.47  CALCIUM 9.4 9.2    Studies/Results: US Ob Comp Less 14 Wks  Result Date: 01/25/2017 CLINICAL DATA:  Initial evaluation for acute right flank and right lower quadrant pain. EXAM: TWIN OBSTETRIC <14WK  OB US COMPARISON:  None. FINDINGS: Number of IUPs:  2 Chorionicity/Amnionicity:  Dichorionic/diamniotic. TWIN 1 Yolk sac:  Present Embryo:  Present Cardiac Activity: Present Heart Rate: 168 bpm CRL:  34.3  mm   10 w 2 d                  [place] EDC: 08/21/2017 TWIN 2 Yolk sac:  Present Embryo:  Present Cardiac Activity: Present Heart Rate: 162 bpm CRL:  38.8  mm   10 w 5 d                  [place] EDC: 08/18/2017 Subchorionic  hemorrhage:  None visualized. Maternal uterus/adnexae: Ovaries are normal in appearance bilaterally. No adnexal mass or other abnormality. No free fluid. IMPRESSION: 1. Twin intrauterine gestation as above, estimated gestational age [redacted] weeks and 2 days for twin A, and 10 weeks and 5 days for twin B. no complication. 2. No other acute maternal uterine or adnexal abnormality identified. Electronically Signed   By: Rise Mu M.D.   On: 01/25/2017 03:08   US Ob Comp Addl Gest Less 14 Wks  Result Date: 01/25/2017 CLINICAL DATA:  Initial evaluation for acute right flank and right lower quadrant pain. EXAM: TWIN OBSTETRIC <14WK  OB US COMPARISON:  None. FINDINGS: Number of IUPs:  2 Chorionicity/Amnionicity:  Dichorionic/diamniotic. TWIN 1 Yolk sac:  Present Embryo:  Present Cardiac Activity: Present Heart Rate: 168 bpm CRL:  34.3  mm   10 w 2 d                  [place] EDC: 08/21/2017 TWIN 2 Yolk sac:  Present Embryo:  Present Cardiac Activity: Present Heart Rate: 162 bpm CRL:  38.8  mm   10 w 5 d                  [place] EDC: 08/18/2017 Subchorionic hemorrhage:  None visualized. Maternal uterus/adnexae: Ovaries are normal in appearance bilaterally. No adnexal mass or other abnormality. No free fluid. IMPRESSION: 1. Twin intrauterine gestation as above, estimated gestational age [redacted] weeks and 2 days for twin A, and 10 weeks and 5 days for twin B. no complication. 2. No other acute maternal uterine or adnexal abnormality identified. Electronically Signed   By: Rise Mu M.D.   On: 01/25/2017 03:08   [place] Intraoperative  Result Date: 01/25/2017 CLINICAL DATA:  Ultrasound was provided for use by the ordering physician, and a technical charge was applied by the performing facility.  No radiologist interpretation/professional services rendered.   US Renal  Result Date: 01/25/2017 CLINICAL DATA:  Initial evaluation for acute right flank and right lower quadrant pain for 3 days. EXAM: RENAL / URINARY TRACT  ULTRASOUND COMPLETE COMPARISON:  Prior CT from 01/08/2007. FINDINGS: Right Kidney: Length: 12.8 cm. Mildly increased echogenicity within the renal parenchyma. No mass lesion. There is an obstructive 14 mm obstructive calculus positioned at the right UPJ with secondary mild right hydronephrosis. Left Kidney: Length:  12.7 cm. Echogenicity within normal limits. No mass or hydronephrosis visualized. Bladder: Appears normal for degree of bladder distention. Bilateral ureteral jets were visualized. IMPRESSION: 1. 14 mm obstructive stone at the right UPJ with secondary mild right hydronephrosis. 2. Normal sonographic appearance of the left kidney. Electronically Signed   By: Rise MuBenjamin  McClintock M.D.   On: 01/25/2017 01:43   [place] Abdomen Limited Ruq  Result Date: 01/25/2017 CLINICAL DATA:  Acute onset of right flank pain. EXAM: ULTRASOUND ABDOMEN LIMITED RIGHT UPPER QUADRANT COMPARISON:  CT of the abdomen and pelvis performed 01/08/2007 FINDINGS: Gallbladder: No gallstones or wall thickening visualized. No sonographic Murphy sign noted by sonographer. Common bile duct: Diameter: 0.4 cm, within normal limits in caliber. Liver: No focal lesion identified. Within normal limits in parenchymal echogenicity. Portal vein is patent on color Doppler imaging with normal direction of blood flow towards the liver. IMPRESSION: Unremarkable ultrasound of the right upper quadrant. Electronically Signed   By: Roanna RaiderJeffery  Chang M.D.   On: 01/25/2017 01:54     Assessment: 26 year old female [redacted] weeks pregnant with twins with obstructing 14 mm right UPJ calculus status post nephrostomy tube placement yesterday.  Clinically well, pain improving.  Plan: -Appropriate for discharge from urological perspective -Would complete 1 week course of empiric antibiotics -Plan for follow-up in 2 weeks to discuss management of her stone during the second trimester -Reviewed nephrostomy tube care, flush daily until clear and then as  needed -Urology will sign off    LOS: 0 days    Courtney Burnett 01/26/2017

## 2017-01-27 ENCOUNTER — Observation Stay: Payer: Medicaid Other

## 2017-01-27 DIAGNOSIS — F17210 Nicotine dependence, cigarettes, uncomplicated: Secondary | ICD-10-CM | POA: Diagnosis present

## 2017-01-27 DIAGNOSIS — O2301 Infections of kidney in pregnancy, first trimester: Secondary | ICD-10-CM | POA: Diagnosis not present

## 2017-01-27 DIAGNOSIS — R109 Unspecified abdominal pain: Secondary | ICD-10-CM | POA: Diagnosis present

## 2017-01-27 DIAGNOSIS — R1011 Right upper quadrant pain: Secondary | ICD-10-CM | POA: Diagnosis present

## 2017-01-27 DIAGNOSIS — Z803 Family history of malignant neoplasm of breast: Secondary | ICD-10-CM | POA: Diagnosis not present

## 2017-01-27 DIAGNOSIS — O99331 Smoking (tobacco) complicating pregnancy, first trimester: Secondary | ICD-10-CM | POA: Diagnosis present

## 2017-01-27 DIAGNOSIS — O30041 Twin pregnancy, dichorionic/diamniotic, first trimester: Secondary | ICD-10-CM | POA: Diagnosis not present

## 2017-01-27 DIAGNOSIS — N136 Pyonephrosis: Secondary | ICD-10-CM

## 2017-01-27 DIAGNOSIS — Z3A10 10 weeks gestation of pregnancy: Secondary | ICD-10-CM

## 2017-01-27 MED ORDER — HYDROMORPHONE HCL 2 MG PO TABS
2.0000 mg | ORAL_TABLET | ORAL | Status: DC | PRN
  Administered 2017-01-27 (×3): 2 mg via ORAL
  Administered 2017-01-28 (×3): 4 mg via ORAL
  Filled 2017-01-27 (×3): qty 2
  Filled 2017-01-27: qty 1
  Filled 2017-01-27: qty 2
  Filled 2017-01-27: qty 1
  Filled 2017-01-27: qty 2

## 2017-01-27 MED ORDER — SODIUM CHLORIDE 0.9 % IJ SOLN
INTRAMUSCULAR | Status: AC
  Administered 2017-01-27: 5 mL via INTRAVENOUS
  Filled 2017-01-27: qty 10

## 2017-01-27 NOTE — Progress Notes (Signed)
Pt transported to ultrasound.

## 2017-01-27 NOTE — Progress Notes (Addendum)
Renal US shows decompression of the right hydro with the NT in good position.  Continue pain management as required.   Subjective: CC: Left flank pain.  Hx:  Anyjah was doing well post right NT placement but this morning she began to have more severe pain in the right flank with radiation to the RLQ.   She is voiding well.  The NT was flushed with clear return and appears to be draining clear urine.   ROS:  Review of Systems  Constitutional: Negative for fever.  Gastrointestinal: Positive for nausea.    Anti-infectives: Anti-infectives (From admission, onward)   Start     Dose/Rate Route Frequency Ordered Stop   01/26/17 1630  Cefixime (SUPRAX) capsule 400 mg     400 mg Oral Daily 01/26/17 1619     01/25/17 2000  cefTRIAXone (ROCEPHIN) 1 g in dextrose 5 % 50 mL IVPB  Status:  Discontinued     1 g 100 mL/hr over 30 Minutes Intravenous Every 12 hours 01/25/17 1909 01/26/17 1615   01/25/17 1215  clindamycin (CLEOCIN) IVPB 600 mg     600 mg 100 mL/hr over 30 Minutes Intravenous  Once 01/25/17 1207 01/25/17 1511   01/25/17 0230  cefTRIAXone (ROCEPHIN) 1 g in dextrose 5 % 50 mL IVPB - Premix     1 g 100 mL/hr over 30 Minutes Intravenous  Once 01/25/17 0229 01/25/17 0335      Current Facility-Administered Medications  Medication Dose Route Frequency Provider Last Rate Last Dose  . 0.9 %  sodium chloride infusion   Intravenous Continuous Irish Lack, MD 10 mL/hr at 01/25/17 1305    . acetaminophen (TYLENOL) tablet 650 mg  650 mg Oral Q4H PRN Schuman, Christanna R, MD      . Cefixime (SUPRAX) capsule 400 mg  400 mg Oral Daily Farrel Conners, CNM   400 mg at 01/26/17 2114  . diphenhydrAMINE (BENADRYL) injection 12.5 mg  12.5 mg Intravenous Q6H PRN Vena Austria, MD   12.5 mg at 01/26/17 2115   Or  . diphenhydrAMINE (BENADRYL) 12.5 MG/5ML elixir 12.5 mg  12.5 mg Oral Q6H PRN Vena Austria, MD      . morphine 2 mg/mL PCA injection   Intravenous Q4H Farrel Conners, CNM       . nicotine (NICODERM CQ - dosed in mg/24 hours) patch 21 mg  21 mg Transdermal Daily Oswaldo Conroy, CNM   21 mg at 01/25/17 0901  . ondansetron (ZOFRAN) injection 4 mg  4 mg Intravenous Q6H PRN Oswaldo Conroy, CNM      . prenatal multivitamin tablet 1 tablet  1 tablet Oral Q1200 Natale Milch, MD   Stopped at 01/25/17 1155  . senna-docusate (Senokot-S) tablet 2 tablet  2 tablet Oral QHS Farrel Conners, CNM   2 tablet at 01/27/17 [phone]  . sodium chloride flush (NS) 0.9 % injection 5 mL  5 mL Intravenous Q8H Irish Lack, MD   5 mL at 01/27/17 0819  . zolpidem (AMBIEN) tablet 5 mg  5 mg Oral QHS PRN Adelene Idler R, MD   5 mg at 01/26/17 2248     Objective: Vital signs in last 24 hours: Temp:  [97.9 F (36.6 C)-98.6 F (37 C)] 98.3 F (36.8 C) (12/15 0747) Pulse Rate:  [77-94] 94 (12/15 0747) Resp:  [14-25] 18 (12/15 0820) BP: (119-130)/(59-74) 119/65 (12/15 0747) SpO2:  [95 %-99 %] 97 % (12/15 0820)  Intake/Output from previous day: 12/14 0701 - 12/15 0700 In: 1325 [P.O.:1320]  Out: 2050 [Urine:2050] Intake/Output this shift: Total I/O In: 5 [Other:5] Out: -    Physical Exam  Constitutional: She is well-developed, well-nourished, and in no distress.  In apparent pain  Abdominal: Soft. There is tenderness (RCVAT and RLQT. ).    Lab Results:  Recent Labs    01/24/17 2118 01/26/17 0430  WBC 15.2* 13.4*  HGB 13.7 12.3  HCT 39.7 35.9  PLT 373 308   BMET Recent Labs    01/24/17 2118 01/26/17 0430  NA 134* 131*  K 3.7 4.0  CL 104 103  CO2 20* 23  GLUCOSE 106* 98  BUN 9 9  CREATININE 0.50 0.47  CALCIUM 9.4 9.2   PT/INR No results for input(s): LABPROT, INR in the last 72 hours. ABG No results for input(s): PHART, HCO3 in the last 72 hours.  Invalid input(s): PCO2, PO2  Studies/Results: [place] Intraoperative  Result Date: 01/25/2017 CLINICAL DATA:  Ultrasound was provided for use by the ordering physician, and a technical charge  was applied by the performing facility.  No radiologist interpretation/professional services rendered.   Ir Nephrostomy Placement Right  Result Date: 01/26/2017 INDICATION: Right hydronephrosis and urinary infection secondary to pelvic renal calculus. Nephrostomy tube placement requested due to current first trimester pregnancy and need to wait until at least the second trimester to address the renal calculus. EXAM: IR NEPHROSTOMY PLACEMENT RIGHT COMPARISON:  Renal ultrasound on 01/25/2017 MEDICATIONS: Clindamycin 600 mg IV; The antibiotic was administered in an appropriate time frame prior to skin puncture. ANESTHESIA/SEDATION: Fentanyl 175 mcg IV Moderate Sedation Time:  43 minutes. The patient was continuously monitored during the procedure by the interventional radiology nurse under my direct supervision. CONTRAST:  20mL ISOVUE-300 IOPAMIDOL (ISOVUE-300) INJECTION 61% - administered into the collecting system(s) FLUOROSCOPY TIME:  Fluoroscopy Time: 3 minutes and 6 seconds. The patient's pelvis was wrapped fully with a lead apron during the procedure due to current pregnancy. COMPLICATIONS: None immediate. PROCEDURE: Informed written consent was obtained from the patient after a thorough discussion of the procedural risks, benefits and alternatives. All questions were addressed. Maximal Sterile Barrier Technique was utilized including caps, mask, sterile gowns, sterile gloves, sterile drape, hand hygiene and skin antiseptic. A timeout was performed prior to the initiation of the procedure. Under ultrasound guidance, a 21 gauge needle was advanced into the posterior lower pole collecting system of the right kidney. After return of urine, diluted contrast was injected into the collecting system. A guidewire was advanced. A transitional dilator was placed. The percutaneous tract was dilated to 10 [place] and a 10 [place] nephrostomy tube advanced. The catheter was secured at the skin with a Prolene retention  suture and StatLock device. It was flushed and connected to a gravity drainage bag. FINDINGS: Ultrasound shows mild to moderate hydronephrosis. A calculus is visible under fluoroscopy at the UPJ. The kidney was relatively high and access was difficult under ultrasound. Ultimately, the nephrostomy tube was able to be advanced into the renal pelvis. It was not fully formed as there was some trouble advancing the tube over a guidewire. Urine return is initially bloody after tube placement. IMPRESSION: Placement of 10 French percutaneous nephrostomy tube in right kidney advanced to the level of the renal pelvis. This will be left to gravity bag drainage. Electronically Signed   By: Irish LackGlenn  Yamagata M.D.   On: 01/26/2017 09:17   I have reviewed her prior notes, labs and imaging.   Assessment and Plan: Right UPJ stone with recent perc tube insertion.  Her pain has recurred but the tube appears to be draining.   I have requested a renal US to assess tube position and presence of hydro.   If the kidney is not draining well,  IR needs to be consulted for consideration of replacement or repositioning of the tube.       LOS: 0 days    Bjorn PippinJohn Sebasthian Stailey 01/27/2017 [phone] ID: Bobetta LimeSara A Morton, female   DOB: 12/01/1990, 26 y.o.   MRN: [id number]

## 2017-01-27 NOTE — Progress Notes (Signed)
Nursing supervisor at bedside this am to address pt's concerns. Discussed possible pt transfer to Med-Surg floor for appropriate management of urostomy. Dr. Bonney AidStaebler paged and notified. Plan to discuss with attending MD during upcoming round, awaiting orders for transfer.

## 2017-01-27 NOTE — Progress Notes (Signed)
Patient reports increased pain since 0500 this AM. Pain is 8/10 and very uncomfortable, tenderness to nephrostomy site and right flank radiating to right lateral mid abdomen. Pt states "it even hurts to breathe deeply in this area". Paged MD. Dr. Annabell HowellsWrenn (Urologist) is enroute to evaluate patient, new orders placed for renal ultrasound. Pt currently using PCA, encouraged use and repositioned for comfort. Pt denies relief from repositioning, toileted, noted with adequate output from voiding and from nephrostomy. Will await further orders from MD. Attending MD notified of change in pt's status.

## 2017-01-27 NOTE — Progress Notes (Signed)
Dr. Judithann SheenSparks has talked with CNM Bridgette HabermannSchmid concerning hospitalist consult and states that the consult can be discontinued.

## 2017-01-27 NOTE — Progress Notes (Signed)
Obstetric and Gynecology  Subjective  Patient still with 5/10 pain, stable on PCA pump. Concerned about her pain and ongoing medical management.  Objective   Vitals:   01/27/17 1223 01/27/17 1242  BP:  (!) 115/56  Pulse:  90  Resp: 20 18  Temp:  98.1 F (36.7 C)  SpO2: 98% 99%     Intake/Output Summary (Last 24 hours) at 01/27/2017 1404 Last data filed at 01/27/2017 1009 Gross per 24 hour  Intake 1205 ml  Output 1700 ml  Net -495 ml    General: NAD Respiratory: No increased work of breathing Extremeties: no edema, erythema, or tenderness Nephrostomy draining clear urine with good output.  Labs: No results found for this or any previous visit (from the past 24 hour(s)).  Cultures: Results for orders placed or performed in visit on 01/23/17  Urine Culture     Status: None   Collection Time: 01/23/17  2:26 PM  Result Value Ref Range Status   Urine Culture, Routine Final report  Final   Organism ID, Bacteria No growth  Final  Chlamydia/Gonococcus/Trichomonas, NAA     Status: None   Collection Time: 01/23/17  3:06 PM  Result Value Ref Range Status   Chlamydia by NAA Negative Negative Final   Gonococcus by NAA Negative Negative Final   Trich vag by NAA Negative Negative Final    Imaging: Renal ultrasound this morning showed 12 mm stone in right UPJ and nephrostomy tube within the right renal pelvis.  Assessment   26 y.o. G1P0 s/p right percutaneous nephrostomy placement with ongoing pain.  Plan   Transfer to [facility] for ongoing care and pain management. Continue Suprax PO for a total of 7 days of therapy. Transition from PCA to oral pain medications when possible. Anticipate discharge tomorrow if pain is well-controlled on oral medications.  Marcelyn BruinsJacelyn Merikay Lesniewski, CNM 01/27/2017  2:09 PM

## 2017-01-28 DIAGNOSIS — O2301 Infections of kidney in pregnancy, first trimester: Secondary | ICD-10-CM

## 2017-01-28 DIAGNOSIS — O30041 Twin pregnancy, dichorionic/diamniotic, first trimester: Secondary | ICD-10-CM

## 2017-01-28 DIAGNOSIS — Z3A10 10 weeks gestation of pregnancy: Secondary | ICD-10-CM

## 2017-01-28 LAB — CBC
HCT: 33.2 % — ABNORMAL LOW (ref 35.0–47.0)
HEMOGLOBIN: 11.5 g/dL — AB (ref 12.0–16.0)
MCH: 30.8 pg (ref 26.0–34.0)
MCHC: 34.6 g/dL (ref 32.0–36.0)
MCV: 89 fL (ref 80.0–100.0)
PLATELETS: 265 10*3/uL (ref 150–440)
RBC: 3.72 MIL/uL — AB (ref 3.80–5.20)
RDW: 13.1 % (ref 11.5–14.5)
WBC: 7.8 10*3/uL (ref 3.6–11.0)

## 2017-01-28 MED ORDER — NICOTINE 21 MG/24HR TD PT24: 21.0000 mg | MEDICATED_PATCH | Freq: Every day | TRANSDERMAL | 0 refills | Status: DC

## 2017-01-28 MED ORDER — SODIUM CHLORIDE FLUSH 0.9 % IV SOLN: 5.0000 mL | INTRAVENOUS | 2 refills | Status: DC | PRN

## 2017-01-28 MED ORDER — CEFIXIME 400 MG PO CAPS: 400.0000 mg | ORAL_CAPSULE | Freq: Every day | ORAL | 0 refills | Status: DC

## 2017-01-28 MED ORDER — HYDROMORPHONE HCL 2 MG PO TABS: 2.0000 mg | ORAL_TABLET | ORAL | 0 refills | Status: DC | PRN

## 2017-01-28 MED ORDER — ACETAMINOPHEN 325 MG PO TABS: 650.0000 mg | ORAL_TABLET | ORAL | 1 refills | Status: DC | PRN

## 2017-01-28 NOTE — Progress Notes (Signed)
Discharge has been ordered. Patient and her sister were given instructions as well as a demonstration on how to change dressing of nephrostomy tube, how to flush and signs and symptoms to look out for. Questions were encouraged and were answered. Printed instructions were provided to patient's husband last night and he understood per the patient. Supplies for dressing changes as well as flushes were provided. Discharge instructions, prescriptions and instructions for follow up appointment was reviewed with patient and her sister. She voiced understanding and was agreeable to discharge.

## 2017-01-28 NOTE — Discharge Summary (Signed)
OB Discharge Summary     Patient Name: Courtney Burnett DOB: 02/25/1990 MRN: [id number]  Date of admission: 01/24/2017 Date of discharge: 01/28/2017  Admitting diagnosis: RUQ pain [R10.11] Pain, abdominal, RLQ [R10.31] Acute pyelonephritis [N10] Ureterolithiasis during pregnancy in first trimester [O26.891, N20.1] Intrauterine pregnancy: 8942w6d, twins confirmed by ultrasound Secondary diagnosis: None     Discharge diagnosis: Renal calculus with nephrostomy tube placement                         Hospital course:  Dichorionic, diamniotic twin gestation at 4610 w 6d. Renal calculus requiring nephrostomy tube placement. Required PCA for pain control. Antibiotics for suspected infection with elevated WBCs, UA positive for nitrites and rare bacteria. WBCs now wnl. Tolerating regular diet and pain control adequate with PO Dilaudid for discharge.                                                             Physical exam on 01/28/2017: Vitals:   01/27/17 1719 01/27/17 1750 01/27/17 2034 01/28/17 0423  BP:  133/66 (!) 124/57 119/70  Pulse:  84 99 78  Resp: 20  (!) 24 16  Temp:  98.5 F (36.9 C) 97.7 F (36.5 C) 98.3 F (36.8 C)  TempSrc:  Oral Oral Oral  SpO2: 98% 99% 100% 99%  Weight:      Height:       General: alert and cooperative Respiratory: No increased work of breathing Incision: Dressing is clean, dry, and intact, nephrostomy tube draining clear urine DVT Evaluation: No evidence of DVT seen on physical exam.  Labs: Lab Results  Component Value Date   WBC 7.8 01/28/2017   HGB 11.5 (L) 01/28/2017   HCT 33.2 (L) 01/28/2017   MCV 89.0 01/28/2017   PLT 265 01/28/2017   CMP Latest Ref Rng & Units 01/26/2017  Glucose 65 - 99 mg/dL 98  BUN 6 - 20 mg/dL 9  Creatinine 3.080.44 - 6.571.00 mg/dL 8.460.47  Sodium 962135 - 952145 mmol/L 131(L)  Potassium 3.5 - 5.1 mmol/L 4.0  Chloride 101 - 111 mmol/L 103  CO2 22 - 32 mmol/L 23  Calcium 8.9 - 10.3 mg/dL 9.2  Total Protein 6.5 - 8.1 g/dL -  Total  Bilirubin 0.3 - 1.2 mg/dL -  Alkaline Phos 38 - 841126 U/L -  AST 15 - 41 U/L -  ALT 14 - 54 U/L -    Discharge instruction: per After Visit Summary.  Medications:  Allergies as of 01/28/2017   No Known Allergies     Medication List    TAKE these medications   acetaminophen 325 MG tablet Commonly known as:  TYLENOL Take 2 tablets (650 mg total) by mouth every 4 (four) hours as needed for mild pain (for pain scale < 4  OR  temperature  >/=  100.5 F).   Cefixime 400 MG Caps capsule Commonly known as:  SUPRAX Take 1 capsule (400 mg total) by mouth daily. Start taking on:  01/29/2017   HYDROmorphone 2 MG tablet Commonly known as:  DILAUDID Take 1-2 tablets (2-4 mg total) by mouth every 4 (four) hours as needed for moderate pain or severe pain.   multivitamin-prenatal 27-0.8 MG Tabs tablet Take 1 tablet by mouth daily at 12 noon.  nicotine 21 mg/24hr patch Commonly known as:  NICODERM CQ - dosed in mg/24 hours Place 1 patch (21 mg total) onto the skin daily.   sodium chloride flush 0.9 % Soln injection Commonly known as:  KENDALL SODIUM CHLORIDE FLUSH 5 mLs by Intracatheter route as needed. Use 5 ml daily to flush nephrostomy tube            Discharge Care Instructions  (From admission, onward)        Start     Ordered   01/28/17 0000  Discharge wound care:     01/28/17 1028      Diet: routine diet  Activity: Advance as tolerated.  Outpatient follow up: Follow-up Information    Vena AustriaStaebler, Andreas, MD. Schedule an appointment as soon as possible for a visit.   Specialty:  Obstetrics and Gynecology Why:  New OB appointment and labs Contact information: [address] [place] [id number] [phone]            Continue Suprax for a total of seven days of therapy. Flush nephrostomy tube daily with 5 mL normal saline. Change dressing as needed if drainage present.  SIGNED: Oswaldo ConroyJacelyn Y Schmid, CNM 01/28/2017 10:45 AM

## 2017-01-29 MED FILL — Morphine Sulfate Inj 2 MG/ML: INTRAVENOUS | Qty: 30 | Status: AC

## 2017-01-30 ENCOUNTER — Ambulatory Visit (INDEPENDENT_AMBULATORY_CARE_PROVIDER_SITE_OTHER): Payer: Medicaid Other | Admitting: Obstetrics and Gynecology

## 2017-01-30 VITALS — BP 132/88 | Wt 219.0 lb

## 2017-01-30 DIAGNOSIS — O30041 Twin pregnancy, dichorionic/diamniotic, first trimester: Secondary | ICD-10-CM

## 2017-01-30 DIAGNOSIS — N201 Calculus of ureter: Secondary | ICD-10-CM

## 2017-01-30 DIAGNOSIS — O0990 Supervision of high risk pregnancy, unspecified, unspecified trimester: Secondary | ICD-10-CM

## 2017-01-30 DIAGNOSIS — Z3A11 11 weeks gestation of pregnancy: Secondary | ICD-10-CM

## 2017-01-30 MED ORDER — HYDROMORPHONE HCL 2 MG PO TABS: 2.0000 mg | ORAL_TABLET | ORAL | 0 refills | Status: DC | PRN

## 2017-01-30 NOTE — Progress Notes (Deleted)
ER Follow Up

## 2017-01-30 NOTE — Progress Notes (Signed)
  Routine Prenatal Care Visit  Subjective  Courtney Burnett is a 26 y.o. G1P0 at 1750w1d being seen today for ongoing prenatal care.  She is currently monitored for the following issues for this high-risk pregnancy and has Supervision of high risk pregnancy, antepartum; Labor and delivery indication for care or intervention; Indication for care/intervention related to labor/delivery, antepartum; Right ureteral stone; Dichorionic diamniotic twin gestation; and Flank pain on their problem list.  ----------------------------------------------------------------------------------- Patient reports continued pain in her right back at the area of her nephrostomy tube placement.  She denies fevers, chills.  She notes drainage of clear urine from her nephrostomy bag that she has to change regularly.  She has not measured how much she has dumped from the bag.  She continues to have a high pain-medication requirement.  She is taking dilaudid 2mg  tablets (2 every 4 hours).  She denies vaginal bleeding. She continues to have normal voids from her bladder.  . Vag. Bleeding: None.   . Denies leaking of fluid.  ----------------------------------------------------------------------------------- The following portions of the patient's history were reviewed and updated as appropriate: allergies, current medications, past family history, past medical history, past social history, past surgical history and problem list. Problem list updated.   Objective  Blood pressure 132/88, weight 219 lb (99.3 kg), last menstrual period 11/13/2016. Pregravid weight 205 lb (93 kg) Total Weight Gain 14 lb (6.35 kg) Urinalysis: Urine Protein: Negative Urine Glucose: Negative  Fetal Status:           General:  Alert, oriented and cooperative. Patient is in no acute distress.  Skin: Skin is warm and dry. No rash noted.   Cardiovascular: Normal heart rate noted  Respiratory: Normal respiratory effort, no problems with respiration noted   Abdomen: Soft, gravid, appropriate for gestational age.      Back: bandages in place around nephrostomy tube. No obvious erythema surrounding the skin at the entry site. The bandages are not removed.  There is clear urine in the tubing of the nephrostomy tube. No left CVA tenderness.  No abdominal tenderness.   Pelvic:  Cervical exam deferred        Extremities: Normal range of motion.     Mental Status: Normal mood and affect. Normal behavior. Normal judgment and thought content.   Assessment   26 y.o. G1P0 at 3950w1d by  08/20/2017, by Last Menstrual Period presenting for work-in prenatal visit  Plan   Pregnancy #1 Problems (from 01/23/17 to present)    Problem Noted Resolved   Dichorionic diamniotic twin gestation 01/25/2017 by Vena AustriaStaebler, Andreas, MD No       Preterm labor symptoms and general obstetric precautions including but not limited to vaginal bleeding, contractions, leaking of fluid and fetal movement were reviewed in detail with the patient. Please refer to After Visit Summary for other counseling recommendations.   - keep appt in 2 days with Dr Bonney AidStaebler - rx given for an additional 20 dilaudid 2mg  tablets.   - precautions for fevers, chills, no output from nephrostomy tube given.  Thomasene MohairStephen Jackson, MD  02/01/2017 10:18 AM

## 2017-01-31 ENCOUNTER — Encounter: Payer: Self-pay | Admitting: Emergency Medicine

## 2017-01-31 ENCOUNTER — Emergency Department
Admission: EM | Admit: 2017-01-31 | Discharge: 2017-01-31 | Disposition: A | Discharge status: Home / Self Care | Payer: Medicaid Other | Attending: Emergency Medicine | Admitting: Emergency Medicine

## 2017-01-31 ENCOUNTER — Emergency Department: Payer: Medicaid Other

## 2017-01-31 ENCOUNTER — Other Ambulatory Visit: Payer: Self-pay | Admitting: Advanced Practice Midwife

## 2017-01-31 DIAGNOSIS — F17210 Nicotine dependence, cigarettes, uncomplicated: Secondary | ICD-10-CM | POA: Insufficient documentation

## 2017-01-31 DIAGNOSIS — Y828 Other medical devices associated with adverse incidents: Secondary | ICD-10-CM | POA: Diagnosis not present

## 2017-01-31 DIAGNOSIS — Z3A11 11 weeks gestation of pregnancy: Secondary | ICD-10-CM | POA: Diagnosis not present

## 2017-01-31 DIAGNOSIS — Z3400 Encounter for supervision of normal first pregnancy, unspecified trimester: Secondary | ICD-10-CM

## 2017-01-31 DIAGNOSIS — T83092A Other mechanical complication of nephrostomy catheter, initial encounter: Secondary | ICD-10-CM | POA: Insufficient documentation

## 2017-01-31 DIAGNOSIS — O99331 Smoking (tobacco) complicating pregnancy, first trimester: Secondary | ICD-10-CM | POA: Insufficient documentation

## 2017-01-31 DIAGNOSIS — Z3682 Encounter for antenatal screening for nuchal translucency: Secondary | ICD-10-CM

## 2017-01-31 DIAGNOSIS — T83098A Other mechanical complication of other indwelling urethral catheter, initial encounter: Secondary | ICD-10-CM

## 2017-01-31 LAB — COMPREHENSIVE METABOLIC PANEL
ALBUMIN: 3.1 g/dL — AB (ref 3.5–5.0)
ALK PHOS: 63 U/L (ref 38–126)
ALT: 17 U/L (ref 14–54)
AST: 20 U/L (ref 15–41)
Anion gap: 4 — ABNORMAL LOW (ref 5–15)
BILIRUBIN TOTAL: 0.3 mg/dL (ref 0.3–1.2)
BUN: 8 mg/dL (ref 6–20)
CALCIUM: 8.4 mg/dL — AB (ref 8.9–10.3)
CO2: 21 mmol/L — ABNORMAL LOW (ref 22–32)
CREATININE: 0.55 mg/dL (ref 0.44–1.00)
Chloride: 106 mmol/L (ref 101–111)
GFR calc Af Amer: >60 mL/min (ref >60)
GLUCOSE: 119 mg/dL — AB (ref 65–99)
POTASSIUM: 3.6 mmol/L (ref 3.5–5.1)
Sodium: 131 mmol/L — ABNORMAL LOW (ref 135–145)
TOTAL PROTEIN: 6.5 g/dL (ref 6.5–8.1)

## 2017-01-31 LAB — CBC WITH DIFFERENTIAL/PLATELET
Basophils Absolute: 0 10*3/uL (ref 0–0.1)
Basophils Relative: 0 %
Eosinophils Absolute: 0.1 10*3/uL (ref 0–0.7)
Eosinophils Relative: 2 %
HEMATOCRIT: 33.7 % — AB (ref 35.0–47.0)
HEMOGLOBIN: 11.6 g/dL — AB (ref 12.0–16.0)
LYMPHS ABS: 1.6 10*3/uL (ref 1.0–3.6)
LYMPHS PCT: 17 %
MCH: 30.5 pg (ref 26.0–34.0)
MCHC: 34.3 g/dL (ref 32.0–36.0)
MCV: 88.9 fL (ref 80.0–100.0)
MONOS PCT: 9 %
Monocytes Absolute: 0.9 10*3/uL (ref 0.2–0.9)
NEUTROS ABS: 6.8 10*3/uL — AB (ref 1.4–6.5)
Neutrophils Relative %: 72 %
Platelets: 344 10*3/uL (ref 150–440)
RBC: 3.79 MIL/uL — ABNORMAL LOW (ref 3.80–5.20)
RDW: 13.3 % (ref 11.5–14.5)
WBC: 9.5 10*3/uL (ref 3.6–11.0)

## 2017-01-31 MED ORDER — GI COCKTAIL ~~LOC~~
ORAL | Status: AC
  Filled 2017-01-31: qty 30
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
## 2017-01-31 NOTE — ED Notes (Signed)
Pt states smaller than usual amount of urine today

## 2017-01-31 NOTE — ED Triage Notes (Signed)
Pt comes into the ED via POV c/o her nephrostomy tube not draining since 13:00 today.  Patient states they have tried to flush and have only had minimal output.  Nephrostomy tube was placed on 01/26/17.  Patient states she has minimal pain at the site.  Denies any fevers or any other complaints at this time.  Patient in NAD with even and unlabored respirations.

## 2017-01-31 NOTE — ED Notes (Signed)
ED Provider at bedside. 

## 2017-02-01 ENCOUNTER — Ambulatory Visit: Payer: Medicaid Other

## 2017-02-01 ENCOUNTER — Other Ambulatory Visit: Payer: Self-pay | Admitting: Obstetrics and Gynecology

## 2017-02-01 ENCOUNTER — Ambulatory Visit (INDEPENDENT_AMBULATORY_CARE_PROVIDER_SITE_OTHER): Payer: Medicaid Other

## 2017-02-01 ENCOUNTER — Encounter: Payer: Self-pay | Admitting: Obstetrics and Gynecology

## 2017-02-01 ENCOUNTER — Ambulatory Visit (INDEPENDENT_AMBULATORY_CARE_PROVIDER_SITE_OTHER): Payer: Medicaid Other | Admitting: Obstetrics and Gynecology

## 2017-02-01 ENCOUNTER — Encounter: Payer: Medicaid Other | Admitting: Obstetrics and Gynecology

## 2017-02-01 ENCOUNTER — Other Ambulatory Visit: Payer: Self-pay | Admitting: Advanced Practice Midwife

## 2017-02-01 VITALS — BP 132/70 | Wt 222.0 lb

## 2017-02-01 DIAGNOSIS — Z3A11 11 weeks gestation of pregnancy: Secondary | ICD-10-CM

## 2017-02-01 DIAGNOSIS — R109 Unspecified abdominal pain: Secondary | ICD-10-CM

## 2017-02-01 DIAGNOSIS — Z3682 Encounter for antenatal screening for nuchal translucency: Secondary | ICD-10-CM

## 2017-02-01 DIAGNOSIS — N201 Calculus of ureter: Secondary | ICD-10-CM

## 2017-02-01 DIAGNOSIS — Z3400 Encounter for supervision of normal first pregnancy, unspecified trimester: Secondary | ICD-10-CM

## 2017-02-01 DIAGNOSIS — O30041 Twin pregnancy, dichorionic/diamniotic, first trimester: Secondary | ICD-10-CM

## 2017-02-01 DIAGNOSIS — O0990 Supervision of high risk pregnancy, unspecified, unspecified trimester: Secondary | ICD-10-CM

## 2017-02-01 DIAGNOSIS — Z113 Encounter for screening for infections with a predominantly sexual mode of transmission: Secondary | ICD-10-CM

## 2017-02-01 DIAGNOSIS — Z1379 Encounter for other screening for genetic and chromosomal anomalies: Secondary | ICD-10-CM

## 2017-02-01 DIAGNOSIS — Z131 Encounter for screening for diabetes mellitus: Secondary | ICD-10-CM

## 2017-02-01 DIAGNOSIS — Z6835 Body mass index (BMI) 35.0-35.9, adult: Secondary | ICD-10-CM

## 2017-02-01 DIAGNOSIS — O99210 Obesity complicating pregnancy, unspecified trimester: Secondary | ICD-10-CM

## 2017-02-01 MED ORDER — HYDROMORPHONE HCL 2 MG PO TABS: 2.0000 mg | ORAL_TABLET | ORAL | 0 refills | Status: DC | PRN

## 2017-02-01 NOTE — Progress Notes (Signed)
Pt out of medication. States she was prescribed medication in clinic today. No note in clinic.  Patient known to have severe pain.  Will rx one more prescription for her.

## 2017-02-01 NOTE — Progress Notes (Signed)
Routine Prenatal Care Visit  Subjective  Courtney Burnett is a 26 y.o. G1P0 at 3156w3d being seen today for ongoing prenatal care.  She is currently monitored for the following issues for this high-risk pregnancy and has Supervision of high risk pregnancy, antepartum; Indication for care/intervention related to labor/delivery, antepartum; Right ureteral stone; Dichorionic diamniotic twin gestation; and Flank pain on their problem list.  ----------------------------------------------------------------------------------- Patient reports no complaints.    . Vag. Bleeding: None.   . Denies leaking of fluid.  ----------------------------------------------------------------------------------- The following portions of the patient's history were reviewed and updated as appropriate: allergies, current medications, past family history, past medical history, past social history, past surgical history and problem list. Problem list updated.   Objective  Blood pressure 132/70, weight 222 lb (100.7 kg), last menstrual period 11/13/2016. Pregravid weight 205 lb (93 kg) Total Weight Gain 17 lb (7.711 kg) Urinalysis: Urine Protein: 1+ Urine Glucose: Negative  Fetal Status: Fetal Heart Rate (bpm): Positive         General:  Alert, oriented and cooperative. Patient is in no acute distress.  Skin: Skin is warm and dry. No rash noted.   Cardiovascular: Normal heart rate noted  Respiratory: Normal respiratory effort, no problems with respiration noted  Abdomen: Soft, gravid, appropriate for gestational age.       Pelvic:  Cervical exam deferred        Extremities: Normal range of motion.     ental Status: Normal mood and affect. Normal behavior. Normal judgment and thought content.     Assessment   26 y.o. G1P0 at 7356w3d by  08/20/2017, by Last Menstrual Period presenting for routine prenatal visit  Plan   Pregnancy #1 Problems (from 01/23/17 to present)    Problem Noted Resolved   Dichorionic  diamniotic twin gestation 01/25/2017 by Vena AustriaStaebler, Shaketta Rill, MD No   Supervision of high risk pregnancy, antepartum 01/23/2017 by Tresea MallGledhill, Jane, CNM No   Overview Signed 01/23/2017  5:06 PM by Tresea MallGledhill, Jane, CNM    [facility]inic [facility] Prenatal Labs  Dating  Blood type:     Genetic Screen 1 Screen:    AFP:     Quad:     NIPS: Antibody:   Anatomic US  Rubella:   Varicella: @VZVIGG @  GTT Early:               Third trimester:  RPR:     Rhogam  HBsAg:     TDaP vaccine                       Flu Shot: HIV:     Baby Food                                GBS:   Contraception  Pap:  CBB     CS/VBAC NA   Support Person Husband Courtney Burnett              Preterm labor symptoms and general obstetric precautions including but not limited to vaginal bleeding, contractions, leaking of fluid and fetal movement were reviewed in detail with the patient. Please refer to After Visit Summary for other counseling recommendations.  - refill dilaudid - doing well with nephrostomy tube, will need to be changes or switched over to stent.  Follow up in 2-4 weeks with urology for planning - 1st trimester screen today normal NT measurements  Return in about 2 weeks (  around 02/15/2017) for ROB with ultrasound to document FHT.

## 2017-02-01 NOTE — Progress Notes (Signed)
ROB First trimester screen today

## 2017-02-02 ENCOUNTER — Other Ambulatory Visit: Payer: Self-pay | Admitting: Obstetrics and Gynecology

## 2017-02-02 ENCOUNTER — Encounter: Payer: Self-pay | Admitting: Obstetrics and Gynecology

## 2017-02-02 LAB — RPR+RH+ABO+RUB AB+AB SCR+CB...
ANTIBODY SCREEN: NEGATIVE
HIV Screen 4th Generation wRfx: NONREACTIVE
Hematocrit: 33.3 % — ABNORMAL LOW (ref 34.0–46.6)
Hemoglobin: 11.2 g/dL (ref 11.1–15.9)
Hepatitis B Surface Ag: NEGATIVE
MCH: 30 pg (ref 26.6–33.0)
MCHC: 33.6 g/dL (ref 31.5–35.7)
MCV: 89 fL (ref 79–97)
PLATELETS: 360 10*3/uL (ref 150–379)
RBC: 3.73 x10E6/uL — ABNORMAL LOW (ref 3.77–5.28)
RDW: 13.2 % (ref 12.3–15.4)
RPR Ser Ql: NONREACTIVE
RUBELLA: 9.03 {index} (ref >0.99)
Rh Factor: POSITIVE
Varicella zoster IgG: 1237 index (ref >165)
WBC: 9.9 10*3/uL (ref 3.4–10.8)

## 2017-02-02 LAB — GLUCOSE, 1 HOUR GESTATIONAL: Gestational Diabetes Screen: 103 mg/dL (ref 65–139)

## 2017-02-02 MED ORDER — NITROFURANTOIN MONOHYD MACRO 100 MG PO CAPS: 100.0000 mg | ORAL_CAPSULE | Freq: Every day | ORAL | 2 refills | Status: DC

## 2017-02-04 ENCOUNTER — Other Ambulatory Visit: Payer: Self-pay | Admitting: Obstetrics and Gynecology

## 2017-02-04 DIAGNOSIS — O0990 Supervision of high risk pregnancy, unspecified, unspecified trimester: Secondary | ICD-10-CM

## 2017-02-04 DIAGNOSIS — N201 Calculus of ureter: Secondary | ICD-10-CM

## 2017-02-04 DIAGNOSIS — R109 Unspecified abdominal pain: Secondary | ICD-10-CM

## 2017-02-04 MED ORDER — HYDROMORPHONE HCL 2 MG PO TABS: 2.0000 mg | ORAL_TABLET | Freq: Four times a day (QID) | ORAL | 0 refills | Status: AC | PRN

## 2017-02-04 NOTE — Progress Notes (Signed)
Spoke with patient.  Still with significant pain burden. I discussed with her that I am becoming more and more concerned with her need for pain medication leading to an addiction to the medication or narcotics in general. Discussed that I will provide some pain medication through Christmas.   She states that percocet was not strong enough while she was in the hospital.  She was switched to dilaudid and this worked better.  She has had three prescriptions totaling 80 pills in the past week.   Last rx 3 days ago for 30 pills.  So, average 10 pills per day.  She states she is now using 1-2 every 4 hours (6-12 per day).   Will provide 2 q 6 hours x 3 days for total of 24 pills. I have paged Urologist on call to get better assessment of expected pain from nephrostomy tube placement, as this seems extreme to me.  She denies fevers, chills, new symptoms.   Instructed her to call first thing tomorrow, 12/24 to get on schedule for 12/26 for in-person assessment of how her nephrostomy site appears and hopefully longer term planning of pain medication. Per Prescription database, she received three other prescriptions totaling 80 pills, as noted above.  No other rx's noted. Will send in pills to [facility] [place]

## 2017-02-05 LAB — FIRST TRIMESTER SCREEN W/NT
CRL: 47.2 mm
CROWN RUMP LENGTH TWIN B: 52.2 mm
DIA MOM: 7.37
DIA VALUE: 1482.3 pg/mL
Gest Age-Collect: 11.9 weeks
Maternal Age At EDD: 26.6 yr
NT MoM Twin B: 0.72
NT TWIN B: 1 mm
NUMBER OF FETUSES: 2
Nuchal Translucency MoM: 0.61
Nuchal Translucency: 0.8 mm
PAPP-A MoM: 1.19
PAPP-A VALUE: 576.2 ng/mL
WEIGHT: 222 [lb_av]
hCG MoM: 2.87
hCG Value: 216.2 IU/mL

## 2017-02-07 ENCOUNTER — Encounter: Payer: Self-pay | Admitting: Obstetrics and Gynecology

## 2017-02-08 ENCOUNTER — Encounter: Payer: Self-pay | Admitting: Obstetrics and Gynecology

## 2017-02-08 ENCOUNTER — Other Ambulatory Visit: Payer: Self-pay | Admitting: Obstetrics and Gynecology

## 2017-02-08 DIAGNOSIS — N201 Calculus of ureter: Secondary | ICD-10-CM

## 2017-02-08 DIAGNOSIS — O0990 Supervision of high risk pregnancy, unspecified, unspecified trimester: Secondary | ICD-10-CM

## 2017-02-08 MED ORDER — HYDROMORPHONE HCL 2 MG PO TABS: 2.0000 mg | ORAL_TABLET | ORAL | 0 refills | Status: DC | PRN

## 2017-02-12 ENCOUNTER — Encounter: Payer: Self-pay | Admitting: Obstetrics and Gynecology

## 2017-02-13 DIAGNOSIS — O1490 Unspecified pre-eclampsia, unspecified trimester: Secondary | ICD-10-CM

## 2017-02-13 HISTORY — DX: Unspecified pre-eclampsia, unspecified trimester: O14.90

## 2017-02-16 ENCOUNTER — Ambulatory Visit (INDEPENDENT_AMBULATORY_CARE_PROVIDER_SITE_OTHER): Payer: Medicaid Other

## 2017-02-16 ENCOUNTER — Telehealth: Payer: Self-pay

## 2017-02-16 ENCOUNTER — Ambulatory Visit (INDEPENDENT_AMBULATORY_CARE_PROVIDER_SITE_OTHER): Payer: Medicaid Other | Admitting: Advanced Practice Midwife

## 2017-02-16 ENCOUNTER — Other Ambulatory Visit: Payer: Self-pay | Admitting: Obstetrics and Gynecology

## 2017-02-16 ENCOUNTER — Encounter: Payer: Self-pay | Admitting: Advanced Practice Midwife

## 2017-02-16 VITALS — BP 136/84 | Wt 217.0 lb

## 2017-02-16 DIAGNOSIS — O30041 Twin pregnancy, dichorionic/diamniotic, first trimester: Secondary | ICD-10-CM

## 2017-02-16 DIAGNOSIS — Z1379 Encounter for other screening for genetic and chromosomal anomalies: Secondary | ICD-10-CM

## 2017-02-16 DIAGNOSIS — O0990 Supervision of high risk pregnancy, unspecified, unspecified trimester: Secondary | ICD-10-CM

## 2017-02-16 DIAGNOSIS — Z3A13 13 weeks gestation of pregnancy: Secondary | ICD-10-CM

## 2017-02-16 NOTE — Progress Notes (Signed)
U/s follow up.  Cannot keep macrobid down.

## 2017-02-16 NOTE — Progress Notes (Signed)
Routine Prenatal Care Visit  Subjective  Courtney Burnett is a 27 y.o. G1P0 at [redacted]w[redacted]d being seen today for ongoing prenatal care.  She is currently monitored for the following issues for this high-risk pregnancy and has Supervision of high risk pregnancy, antepartum; Right ureteral stone; Dichorionic diamniotic twin gestation; and Flank pain on their problem list.  ----------------------------------------------------------------------------------- Patient reports difficulty with keeping her Macrobid down. She gags on the pill and throws up just after taking it. Suggested she put the pill in yogurt or applesauce when taking it to see if that helps. She is informed not to open the capsule as it is a controlled release medication. If she continues to have trouble with the Macrobid we can talk about taking a different antibiotic.  She also has a question about her nephrostomy tube that the area around it is red. She denies fever or other signs of infection and knows to go to the ER for concerns.  She is taking pain medicine PRN now rather than scheduled.   . Vag. Bleeding: None.   . Denies leaking of fluid.  ----------------------------------------------------------------------------------- The following portions of the patient's history were reviewed and updated as appropriate: allergies, current medications, past family history, past medical history, past social history, past surgical history and problem list. Problem list updated.   Objective  Blood pressure 136/84, weight 217 lb (98.4 kg), last menstrual period 11/13/2016. Pregravid weight 205 lb (93 kg) Total Weight Gain 12 lb (5.443 kg) Urinalysis: Urine Protein: Negative Urine Glucose: Negative  Fetal Status:           Ultrasound follow up today for fetal heart tones:  Fetus A heart rate: 158 Fetus B heart rate: 148 Both are measuring accurate with dates  General:  Alert, oriented and cooperative. Patient is in no acute distress.  Skin:  Skin is warm and dry. No rash noted.   Cardiovascular: Normal heart rate noted  Respiratory: Normal respiratory effort, no problems with respiration noted  Abdomen: Soft, gravid, appropriate for gestational age. Pain/Pressure: Absent     Pelvic:  Cervical exam deferred        Extremities: Normal range of motion.     Mental Status: Normal mood and affect. Normal behavior. Normal judgment and thought content.   Assessment   27 y.o. G1P0 at [redacted]w[redacted]d by  08/20/2017, by Last Menstrual Period presenting for routine prenatal visit  Plan   Pregnancy #1 Problems (from 01/23/17 to present)    Problem Noted Resolved   Dichorionic diamniotic twin gestation 01/25/2017 by Vena Austria, MD No   Overview Signed 02/02/2017  9:26 AM by Vena Austria, MD    [ ]  Monthly growth scan after 20 week      Supervision of high risk pregnancy, antepartum 01/23/2017 by Tresea Mall, CNM No   Overview Addendum 02/07/2017  1:12 PM by Vena Austria, MD    [facility] Prenatal Labs (drawn 02/01/17)  Dating LMP = 10 week [place] Blood type: O positive  Genetic Screen 1 Screen: Negative Antibody:negative  Anatomic [place]  Rubella: Immune Varicella: Immune  GTT Early:               Third trimester:  RPR: NR  Rhogam  HBsAg: negative  TDaP vaccine                       Flu Shot: HIV: negative  [employer]  GBS:   Contraception  Pap:  CBB     CS/VBAC NA   Support Person Husband Brandon              Preterm labor symptoms and general obstetric precautions including but not limited to vaginal bleeding, contractions, leaking of fluid and fetal movement were reviewed in detail with the patient.    Return in about 2 weeks (around 03/02/2017) for rob.  Tresea MallJane Ceri Mayer, CNM  02/16/2017 11:47 AM

## 2017-02-16 NOTE — Telephone Encounter (Signed)
Pt called stating her neph tube insertion site is red and swollen. Pt denied fever at site, n/v, f/c, dysuria. Pt voiced concern of infection and stated that she is not able to keep down the macrobid given by OB. Per Dr. Sherryl BartersBudzyn as long as neph tube is draining then theres not much more that can be done. Pt should take tylenol for discomfort and monitor site for heat. Dr. Sherryl BartersBudzyn also stated that he feels like the discomfort is from manipulation of the neph tube. Also reinforced with pt to make OB aware not able to get abx down. Pt voiced understanding of whole conversation.

## 2017-02-19 ENCOUNTER — Encounter: Payer: Self-pay | Admitting: Obstetrics and Gynecology

## 2017-02-20 ENCOUNTER — Telehealth: Payer: Self-pay | Admitting: Obstetrics and Gynecology

## 2017-02-20 NOTE — Telephone Encounter (Signed)
Called and left voice mail for patient to call back to be schedule °

## 2017-02-20 NOTE — Telephone Encounter (Signed)
-----   Message from Vena AustriaAndreas Staebler, MD sent at 02/19/2017  5:27 PM EST ----- Regarding: Follow up Can we get a follow up with me sometime next week

## 2017-02-22 ENCOUNTER — Encounter: Payer: Self-pay | Admitting: Urology

## 2017-02-22 ENCOUNTER — Ambulatory Visit
Admission: RE | Admit: 2017-02-22 | Discharge: 2017-02-22 | Disposition: A | Discharge status: Home / Self Care | Payer: Medicaid Other | Source: Ambulatory Visit | Attending: Urology | Admitting: Urology

## 2017-02-22 ENCOUNTER — Ambulatory Visit (INDEPENDENT_AMBULATORY_CARE_PROVIDER_SITE_OTHER): Payer: Medicaid Other | Admitting: Urology

## 2017-02-22 VITALS — BP 143/81 | HR 108 | Ht 64.0 in | Wt 217.0 lb

## 2017-02-22 DIAGNOSIS — Z3A14 14 weeks gestation of pregnancy: Secondary | ICD-10-CM | POA: Diagnosis not present

## 2017-02-22 DIAGNOSIS — N201 Calculus of ureter: Secondary | ICD-10-CM

## 2017-02-22 DIAGNOSIS — T83098A Other mechanical complication of other indwelling urethral catheter, initial encounter: Secondary | ICD-10-CM

## 2017-02-22 DIAGNOSIS — O26892 Other specified pregnancy related conditions, second trimester: Secondary | ICD-10-CM | POA: Diagnosis not present

## 2017-02-22 DIAGNOSIS — N99528 Other complication of other external stoma of urinary tract: Secondary | ICD-10-CM | POA: Diagnosis present

## 2017-02-22 HISTORY — PX: IR NEPHROSTOMY TUBE CHANGE: IMG1442

## 2017-02-22 MED ORDER — IOPAMIDOL (ISOVUE-300) INJECTION 61%
30.0000 mL | Freq: Once | INTRAVENOUS | Status: AC | PRN
  Administered 2017-02-22: 10 mL

## 2017-02-22 MED ORDER — FENTANYL CITRATE (PF) 100 MCG/2ML IJ SOLN
INTRAMUSCULAR | Status: AC
  Filled 2017-02-22: qty 4

## 2017-02-22 MED ORDER — SODIUM CHLORIDE FLUSH 0.9 % IV SOLN
INTRAVENOUS | Status: AC
  Filled 2017-02-22: qty 80

## 2017-02-22 MED ORDER — SODIUM CHLORIDE 0.9 % IV SOLN: INTRAVENOUS | Status: DC

## 2017-02-22 MED ORDER — LIDOCAINE HCL (PF) 1 % IJ SOLN
INTRAMUSCULAR | Status: AC
  Filled 2017-02-22: qty 30

## 2017-02-22 MED ORDER — FENTANYL CITRATE (PF) 100 MCG/2ML IJ SOLN
INTRAMUSCULAR | Status: AC | PRN
  Administered 2017-02-22 (×2): 25 ug via INTRAVENOUS
  Administered 2017-02-22 (×2): 12.5 ug via INTRAVENOUS
  Administered 2017-02-22 (×2): 25 ug via INTRAVENOUS

## 2017-02-22 NOTE — Progress Notes (Signed)
Patient instructed to flush nephrostomy tube BID with 5 ml saline-inject-do not aspirate.
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
## 2017-02-22 NOTE — Procedures (Signed)
  Pre-operative Diagnosis: Right UPJ stone, preganancy       Post-operative Diagnosis: Right UPJ stone, pregnancy and occluded nephrostomy   Indications: No drainage from right nephrostomy  Procedure: Right nephrostomy tube exchange  Findings: Nephrostomy completely occluded and unable to remove over a wire.  The entire nephrostomy was removed and able to place a new tube along old tract.  New tube coiled in renal pelvis and UPJ.  Complications: None     EBL: Minimal  Plan: Instructed patient to flush the drain daily.
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
## 2017-02-23 ENCOUNTER — Telehealth: Payer: Self-pay

## 2017-02-23 MED ORDER — OXYCODONE-ACETAMINOPHEN 5-325 MG PO TABS: 1.0000 | ORAL_TABLET | ORAL | 0 refills | Status: DC | PRN

## 2017-02-23 NOTE — Telephone Encounter (Signed)
That would be up to her surgeon

## 2017-02-23 NOTE — Telephone Encounter (Signed)
Let her know we have eRx Percocet to get thru the weekend for her pain.  If it worsens, she does need to speak to the Urologist to specifically investigate or treat her pain. Thanks.

## 2017-02-23 NOTE — Telephone Encounter (Signed)
Pt just got out of surgery and was told to call [facility] if she needed pain medication.  She would rather have percocet instead of dilaudid.   [phone] [facility] [place]

## 2017-02-23 NOTE — Telephone Encounter (Signed)
[facility] patient  

## 2017-02-23 NOTE — Telephone Encounter (Signed)
Letter sent through mychart.

## 2017-02-23 NOTE — Telephone Encounter (Signed)
Pt aware.

## 2017-02-23 NOTE — Telephone Encounter (Signed)
Please advise 

## 2017-02-24 ENCOUNTER — Encounter: Payer: Self-pay | Admitting: Urology

## 2017-03-02 ENCOUNTER — Ambulatory Visit (INDEPENDENT_AMBULATORY_CARE_PROVIDER_SITE_OTHER): Payer: Medicaid Other | Admitting: Obstetrics & Gynecology

## 2017-03-02 VITALS — BP 140/80 | Wt 217.0 lb

## 2017-03-02 DIAGNOSIS — O0990 Supervision of high risk pregnancy, unspecified, unspecified trimester: Secondary | ICD-10-CM

## 2017-03-02 DIAGNOSIS — Z3A15 15 weeks gestation of pregnancy: Secondary | ICD-10-CM

## 2017-03-02 DIAGNOSIS — O30042 Twin pregnancy, dichorionic/diamniotic, second trimester: Secondary | ICD-10-CM

## 2017-03-02 MED ORDER — OXYCODONE-ACETAMINOPHEN 5-325 MG PO TABS: 1.0000 | ORAL_TABLET | ORAL | 0 refills | Status: DC | PRN

## 2017-03-02 NOTE — Patient Instructions (Signed)

## 2017-03-02 NOTE — Progress Notes (Signed)
  Subjective  Fetal Movement? no Contractions? no Leaking Fluid? no Vaginal Bleeding? no Pain from tubes.  No dysuria Objective  BP 140/80   Wt 217 lb (98.4 kg)   LMP 11/13/2016 Comment: signed appropriate consent  BMI 37.25 kg/m  General: NAD Pumonary: no increased work of breathing Abdomen: gravid, non-tender Extremities: no edema Psychiatric: mood appropriate, affect full  Assessment  27 y.o. G1P0 at 6940w4d by  08/20/2017, by Last Menstrual Period presenting for routine prenatal visit  Plan   Problem List Items Addressed This Visit      Other   Supervision of high risk pregnancy, antepartum   Dichorionic diamniotic twin gestation    Other Visit Diagnoses    [redacted] weeks gestation of pregnancy    -  Primary    Cont Nephrostomy tube until PP, per pt desire and urology plan No ABX needed as no pyelo or h/o recurrent UTI Percocet for pain, risks of opioid addiction w pt and fetus discussed.  Switch to tylenol over next week PNV US nv  Annamarie MajorPaul Shamon Cothran, MD, Merlinda FrederickFACOG [facility], [facility] 03/02/2017  5:07 PM

## 2017-03-07 ENCOUNTER — Other Ambulatory Visit: Payer: Self-pay | Admitting: Urology

## 2017-03-07 ENCOUNTER — Telehealth: Payer: Self-pay | Admitting: Urology

## 2017-03-07 ENCOUNTER — Other Ambulatory Visit: Payer: Self-pay | Admitting: Radiology

## 2017-03-07 DIAGNOSIS — N201 Calculus of ureter: Secondary | ICD-10-CM

## 2017-03-07 DIAGNOSIS — Z3492 Encounter for supervision of normal pregnancy, unspecified, second trimester: Secondary | ICD-10-CM

## 2017-03-07 DIAGNOSIS — N135 Crossing vessel and stricture of ureter without hydronephrosis: Secondary | ICD-10-CM

## 2017-03-07 NOTE — Telephone Encounter (Signed)
Pt was asked to make a decision this week and has decided to go ahead with surgery. Pt states she has emailed twice with no response.  Please advise. Thanks.

## 2017-03-07 NOTE — Telephone Encounter (Signed)
???    I emailed her back this AM (only saw one message today).  Amy should be reaching out to her.    Vanna ScotlandAshley Elliott Quade, MD

## 2017-03-08 ENCOUNTER — Encounter: Payer: Self-pay | Admitting: Emergency Medicine

## 2017-03-08 ENCOUNTER — Other Ambulatory Visit: Payer: Self-pay | Admitting: Obstetrics & Gynecology

## 2017-03-08 ENCOUNTER — Other Ambulatory Visit: Payer: Self-pay

## 2017-03-08 ENCOUNTER — Telehealth: Payer: Self-pay

## 2017-03-08 ENCOUNTER — Other Ambulatory Visit: Payer: Self-pay | Admitting: Obstetrics and Gynecology

## 2017-03-08 ENCOUNTER — Emergency Department
Admission: EM | Admit: 2017-03-08 | Discharge: 2017-03-08 | Disposition: A | Discharge status: Home / Self Care | Payer: Medicaid Other | Attending: Nurse Practitioner | Admitting: Nurse Practitioner

## 2017-03-08 ENCOUNTER — Emergency Department
Admit: 2017-03-08 | Discharge: 2017-03-08 | Disposition: A | Discharge status: Home / Self Care | Payer: Medicaid Other | Attending: Emergency Medicine | Admitting: Emergency Medicine

## 2017-03-08 ENCOUNTER — Other Ambulatory Visit: Payer: Self-pay | Admitting: Radiology

## 2017-03-08 DIAGNOSIS — N3000 Acute cystitis without hematuria: Secondary | ICD-10-CM

## 2017-03-08 DIAGNOSIS — Z3A16 16 weeks gestation of pregnancy: Secondary | ICD-10-CM | POA: Insufficient documentation

## 2017-03-08 DIAGNOSIS — O2392 Unspecified genitourinary tract infection in pregnancy, second trimester: Secondary | ICD-10-CM | POA: Insufficient documentation

## 2017-03-08 DIAGNOSIS — T83092A Other mechanical complication of nephrostomy catheter, initial encounter: Secondary | ICD-10-CM

## 2017-03-08 DIAGNOSIS — F17210 Nicotine dependence, cigarettes, uncomplicated: Secondary | ICD-10-CM | POA: Diagnosis not present

## 2017-03-08 DIAGNOSIS — N23 Unspecified renal colic: Secondary | ICD-10-CM

## 2017-03-08 DIAGNOSIS — O99332 Smoking (tobacco) complicating pregnancy, second trimester: Secondary | ICD-10-CM | POA: Diagnosis not present

## 2017-03-08 DIAGNOSIS — O26832 Pregnancy related renal disease, second trimester: Secondary | ICD-10-CM | POA: Diagnosis not present

## 2017-03-08 DIAGNOSIS — N99528 Other complication of other external stoma of urinary tract: Secondary | ICD-10-CM

## 2017-03-08 DIAGNOSIS — O9989 Other specified diseases and conditions complicating pregnancy, childbirth and the puerperium: Secondary | ICD-10-CM | POA: Diagnosis present

## 2017-03-08 HISTORY — PX: IR NEPHROSTOMY EXCHANGE RIGHT: IMG6070

## 2017-03-08 LAB — CBC
HEMATOCRIT: 36.3 % (ref 35.0–47.0)
HEMOGLOBIN: 12.4 g/dL (ref 12.0–16.0)
MCH: 30.1 pg (ref 26.0–34.0)
MCHC: 34.2 g/dL (ref 32.0–36.0)
MCV: 88.1 fL (ref 80.0–100.0)
Platelets: 413 10*3/uL (ref 150–440)
RBC: 4.12 MIL/uL (ref 3.80–5.20)
RDW: 13.5 % (ref 11.5–14.5)
WBC: 10.7 10*3/uL (ref 3.6–11.0)

## 2017-03-08 LAB — URINALYSIS, COMPLETE (UACMP) WITH MICROSCOPIC
Bacteria, UA: NONE SEEN
Bilirubin Urine: NEGATIVE
Glucose, UA: NEGATIVE mg/dL
KETONES UR: NEGATIVE mg/dL
Nitrite: NEGATIVE
PROTEIN: 30 mg/dL — AB
SPECIFIC GRAVITY, URINE: 1.017 (ref 1.005–1.030)
SQUAMOUS EPITHELIAL / LPF: NONE SEEN
Specific Gravity, Urine: 1.014 (ref 1.005–1.030)
pH: 6 (ref 5.0–8.0)

## 2017-03-08 LAB — BASIC METABOLIC PANEL
Anion gap: 7 (ref 5–15)
BUN: 6 mg/dL (ref 6–20)
CHLORIDE: 105 mmol/L (ref 101–111)
CO2: 22 mmol/L (ref 22–32)
CREATININE: 0.57 mg/dL (ref 0.44–1.00)
Calcium: 8.9 mg/dL (ref 8.9–10.3)
GFR calc Af Amer: >60 mL/min (ref >60)
GFR calc non Af Amer: >60 mL/min (ref >60)
Glucose, Bld: 101 mg/dL — ABNORMAL HIGH (ref 65–99)
Potassium: 3.7 mmol/L (ref 3.5–5.1)
SODIUM: 134 mmol/L — AB (ref 135–145)

## 2017-03-08 MED ORDER — LIDOCAINE HCL 1 % IJ SOLN
INTRAMUSCULAR | Status: AC | PRN
  Administered 2017-03-08: 4 mL via INTRADERMAL

## 2017-03-08 MED ORDER — IOPAMIDOL (ISOVUE-300) INJECTION 61%: 10.0000 mL | Freq: Once | INTRAVENOUS | Status: DC | PRN

## 2017-03-08 MED ORDER — OXYCODONE-ACETAMINOPHEN 5-325 MG PO TABS: 1.0000 | ORAL_TABLET | ORAL | 0 refills | Status: DC | PRN

## 2017-03-08 MED ORDER — CLINDAMYCIN PHOSPHATE 600 MG/50ML IV SOLN
600.0000 mg | Freq: Once | INTRAVENOUS | Status: AC
  Administered 2017-03-08: 600 mg via INTRAVENOUS
  Filled 2017-03-08: qty 50

## 2017-03-08 MED ORDER — HYDROMORPHONE HCL 1 MG/ML IJ SOLN
0.5000 mg | Freq: Once | INTRAMUSCULAR | Status: AC
Start: 2017-03-08 — End: 2017-03-08
  Administered 2017-03-08: 0.5 mg via INTRAVENOUS
  Filled 2017-03-08: qty 1

## 2017-03-08 MED ORDER — FENTANYL CITRATE (PF) 100 MCG/2ML IJ SOLN
INTRAMUSCULAR | Status: AC | PRN
  Administered 2017-03-08 (×2): 50 ug via INTRAVENOUS

## 2017-03-08 MED ORDER — FENTANYL CITRATE (PF) 100 MCG/2ML IJ SOLN
INTRAMUSCULAR | Status: AC
  Filled 2017-03-08: qty 2

## 2017-03-08 MED ORDER — NITROFURANTOIN MONOHYD MACRO 100 MG PO CAPS
100.0000 mg | ORAL_CAPSULE | Freq: Once | ORAL | Status: AC
  Administered 2017-03-08: 100 mg via ORAL
  Filled 2017-03-08: qty 1

## 2017-03-08 MED ORDER — SODIUM CHLORIDE FLUSH 0.9 % IV SOLN
INTRAVENOUS | Status: AC
  Filled 2017-03-08: qty 10

## 2017-03-08 MED ORDER — CEPHALEXIN 500 MG PO CAPS: 500.0000 mg | ORAL_CAPSULE | Freq: Three times a day (TID) | ORAL | 0 refills | Status: AC

## 2017-03-08 MED ORDER — ONDANSETRON HCL 4 MG/2ML IJ SOLN
4.0000 mg | Freq: Once | INTRAMUSCULAR | Status: AC
  Administered 2017-03-08: 4 mg via INTRAVENOUS
  Filled 2017-03-08: qty 2

## 2017-03-08 MED ORDER — SODIUM CHLORIDE 0.9 % IV BOLUS (SEPSIS)
1000.0000 mL | Freq: Once | INTRAVENOUS | Status: AC
  Administered 2017-03-08: 1000 mL via INTRAVENOUS

## 2017-03-08 MED ORDER — NITROFURANTOIN MONOHYD MACRO 100 MG PO CAPS: 100.0000 mg | ORAL_CAPSULE | Freq: Two times a day (BID) | ORAL | 0 refills | Status: AC

## 2017-03-08 MED ORDER — HYDROMORPHONE HCL 1 MG/ML IJ SOLN: 0.5000 mg | Freq: Once | INTRAMUSCULAR | Status: DC

## 2017-03-08 MED ORDER — ACETAMINOPHEN 500 MG PO TABS
1000.0000 mg | ORAL_TABLET | Freq: Once | ORAL | Status: AC
Start: 2017-03-08 — End: 2017-03-08
  Administered 2017-03-08: 1000 mg via ORAL
  Filled 2017-03-08: qty 2

## 2017-03-08 MED ORDER — LIDOCAINE HCL (PF) 1 % IJ SOLN
INTRAMUSCULAR | Status: AC
  Filled 2017-03-08: qty 30

## 2017-03-08 NOTE — Progress Notes (Signed)
Dr. Grace IsaacWatts at bedside speaking with pt. Re: changing of neph tube. Pt. Verbalizes understanding.

## 2017-03-08 NOTE — ED Notes (Signed)
Pt now staying here to have nephrostomy tube replaced. No needs at this time. Aware needs to remain NPO

## 2017-03-08 NOTE — ED Triage Notes (Signed)
Pt with abd pain and nephrology tube that is not draining.

## 2017-03-08 NOTE — ED Notes (Signed)
[facility]  CALLED  FOR  TRANSFER 

## 2017-03-08 NOTE — ED Notes (Signed)
Requesting to speak with dr Sharma Covertnorman and for pain medication.  Dr Sharma Covertnorman notified.

## 2017-03-08 NOTE — ED Notes (Signed)
Pt not wanting to have procedure to replace tube and then have to go back for stone removal. Dr Sharma Covertnorman at bedside to discuss options with pt.

## 2017-03-08 NOTE — Telephone Encounter (Signed)
Pt called triage line stating she is about to go into surgery once again to get her tube changed. She is wanting a refill on her Oxycodone since the hospital will not give her any. CB# [phone]

## 2017-03-08 NOTE — ED Notes (Signed)
Transported to specials for nephrostomy tube replacement

## 2017-03-08 NOTE — ED Notes (Signed)
This RN asked Dr. Sharma CovertNorman about FHT d/t pt pregnant with twins. Per Dr. Sharma CovertNorman this RN does not need to perform FHT d/t pt feeling both babies moving.

## 2017-03-08 NOTE — ED Notes (Signed)
Pt updated and informed that IR should be ready a little after 300 for her.

## 2017-03-08 NOTE — Telephone Encounter (Signed)
Pt is in surgery now

## 2017-03-08 NOTE — Discharge Instructions (Addendum)
Please restart taking Macrobid for urinary infection.  Drink plenty of fluids to stay well-hydrated.  Please make an appoint with Dr. Apolinar JunesBrandon for reevaluation.  Return to the emergency department if you develop severe pain, lightheadedness or fainting, nausea or vomiting, fever, or any other symptoms concerning to you.

## 2017-03-08 NOTE — Telephone Encounter (Signed)
Too soon to refill today, based on narcotic prescribing rules and rec's we have to follow.  When is procedure?

## 2017-03-08 NOTE — Procedures (Signed)
Pre Procedure Dx: Nephrolithiasis Post Procedure Dx: Same  Successful right sided PCN exchange and upsizing.  EBL: None   No immediate complications.   Katherina RightJay Deanta Mincey, MD Pager #: [phone]
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
## 2017-03-09 ENCOUNTER — Ambulatory Visit (INDEPENDENT_AMBULATORY_CARE_PROVIDER_SITE_OTHER): Payer: Medicaid Other

## 2017-03-09 ENCOUNTER — Encounter: Admit: 2017-03-09 | Payer: Medicaid Other

## 2017-03-09 VITALS — BP 119/76 | HR 102 | Ht 64.0 in | Wt 217.0 lb

## 2017-03-09 DIAGNOSIS — N200 Calculus of kidney: Secondary | ICD-10-CM | POA: Diagnosis not present

## 2017-03-09 LAB — URINALYSIS, COMPLETE
BILIRUBIN UA: NEGATIVE
GLUCOSE, UA: NEGATIVE
KETONES UA: NEGATIVE
NITRITE UA: NEGATIVE
SPEC GRAV UA: 1.01 (ref 1.005–1.030)
UUROB: 0.2 mg/dL (ref 0.2–1.0)
pH, UA: 7 (ref 5.0–7.5)

## 2017-03-09 LAB — MICROSCOPIC EXAMINATION: Epithelial Cells (non renal): NONE SEEN /hpf (ref 0–10)

## 2017-03-09 NOTE — Progress Notes (Signed)
Pt presented today for u/a and cx's. One specimen was obtained for nephrostomy tube and the other was a clean catch. Pt tolerated well.   Blood pressure 119/76, pulse (!) 102, height 5\' 4"  (1.626 m), weight 217 lb (98.4 kg), last menstrual period 11/13/2016.

## 2017-03-10 LAB — URINE CULTURE

## 2017-03-12 ENCOUNTER — Other Ambulatory Visit: Payer: Self-pay

## 2017-03-12 ENCOUNTER — Encounter
Admission: RE | Admit: 2017-03-12 | Discharge: 2017-03-12 | Disposition: A | Discharge status: Home / Self Care | Payer: Medicaid Other | Source: Ambulatory Visit | Attending: Urology | Admitting: Urology

## 2017-03-12 HISTORY — DX: Personal history of urinary calculi: Z87.442

## 2017-03-12 NOTE — Patient Instructions (Addendum)
Your procedure is scheduled on: Wednesday 03/14/17 Report to DAY SURGERY DEPARTMENT LOCATED ON 2ND FLOOR MEDICAL MALL ENTRANCE. To find out your arrival time please call [phone] between 1PM - 3PM on Tuesday 03/13/17.  Remember: Instructions that are not followed completely may result in serious medical risk, up to and including death, or upon the discretion of your surgeon and anesthesiologist your surgery may need to be rescheduled.     _X__ 1. Do not eat food after midnight the night before your procedure.                 No gum chewing or hard candies. You may drink clear liquids up to 2 hours                 before you are scheduled to arrive for your surgery- DO not drink clear                 liquids within 2 hours of the start of your surgery.                 Clear Liquids include:  water, apple juice without pulp, clear carbohydrate                 drink such as Clearfast of Gartorade, Black Coffee or Tea (Do not add                 anything to coffee or tea).  __X__2.  On the morning of surgery brush your teeth with toothpaste and water, you  may rinse your mouth with mouthwash if you wish.  Do not swallow any               toothpaste of mouthwash.     _X__ 3.  No Alcohol for 24 hours before or after surgery.   _X__ 4.  Do Not Smoke or use e-cigarettes For 24 Hours Prior to Your Surgery.                 Do not use any chewable tobacco products for at least 6 hours prior to                 surgery.  ____  5.  Bring all medications with you on the day of surgery if instructed.   ____  6.  Notify your doctor if there is any change in your medical condition      (cold, fever, infections).     Do not wear jewelry, make-up, hairpins, clips or nail polish. Do not wear lotions, powders, or perfumes. You may wear deodorant. Do not shave 48 hours prior to surgery. Men may shave face and neck. Do not bring valuables to the hospital.    [facility] is not responsible for any  belongings or valuables.  Contacts, dentures or bridgework may not be worn into surgery. Leave your suitcase in the car. After surgery it may be brought to your room. For patients admitted to the hospital, discharge time is determined by your treatment team.   Patients discharged the day of surgery will not be allowed to drive home.   Please read over the following fact sheets that you were given:   MRSA Information   __X__ Take these medicines the morning of surgery with A SIP OF WATER:    1. NONE  2. MAY TAKE PAIN MED ONLY IF NEEDED  3.   4.  5.  6.  ____ Fleet Enema (as directed)  ____ Use CHG Soap as directed  ____ Use inhalers on the day of surgery  ____ Stop metformin 2 days prior to surgery    ____ Take 1/2 of usual insulin dose the night before surgery. No insulin the morning          of surgery.   ____ Stop Coumadin/Plavix/aspirin on   ____ Stop Anti-inflammatories on    ____ Stop supplements until after surgery.    ____ Bring C-Pap to the hospital.

## 2017-03-13 LAB — CULTURE, URINE COMPREHENSIVE

## 2017-03-13 MED ORDER — DEXTROSE 5 % IV SOLN
1.0000 g | INTRAVENOUS | Status: AC
  Administered 2017-03-14: 1 g via INTRAVENOUS
  Filled 2017-03-13 (×2): qty 10

## 2017-03-14 ENCOUNTER — Ambulatory Visit: Payer: Medicaid Other | Admitting: Anesthesiology

## 2017-03-14 ENCOUNTER — Ambulatory Visit
Admission: RE | Admit: 2017-03-14 | Discharge: 2017-03-14 | Disposition: A | Discharge status: Home / Self Care | Payer: Medicaid Other | Source: Ambulatory Visit | Attending: Urology | Admitting: Urology

## 2017-03-14 ENCOUNTER — Encounter
Admission: RE | Disposition: A | Discharge status: Home / Self Care | Payer: Self-pay | Source: Ambulatory Visit | Attending: Urology

## 2017-03-14 ENCOUNTER — Encounter: Payer: Self-pay | Admitting: *Deleted

## 2017-03-14 ENCOUNTER — Emergency Department
Admission: EM | Admit: 2017-03-14 | Discharge: 2017-03-14 | Disposition: A | Discharge status: Home / Self Care | Payer: Medicaid Other | Attending: Student in an Organized Health Care Education/Training Program | Admitting: Student in an Organized Health Care Education/Training Program

## 2017-03-14 ENCOUNTER — Other Ambulatory Visit: Payer: Self-pay

## 2017-03-14 DIAGNOSIS — Z3A17 17 weeks gestation of pregnancy: Secondary | ICD-10-CM | POA: Diagnosis not present

## 2017-03-14 DIAGNOSIS — N132 Hydronephrosis with renal and ureteral calculous obstruction: Secondary | ICD-10-CM | POA: Insufficient documentation

## 2017-03-14 DIAGNOSIS — J45909 Unspecified asthma, uncomplicated: Secondary | ICD-10-CM | POA: Diagnosis not present

## 2017-03-14 DIAGNOSIS — N135 Crossing vessel and stricture of ureter without hydronephrosis: Secondary | ICD-10-CM

## 2017-03-14 DIAGNOSIS — F17210 Nicotine dependence, cigarettes, uncomplicated: Secondary | ICD-10-CM | POA: Diagnosis not present

## 2017-03-14 DIAGNOSIS — O30042 Twin pregnancy, dichorionic/diamniotic, second trimester: Secondary | ICD-10-CM | POA: Diagnosis not present

## 2017-03-14 DIAGNOSIS — O99512 Diseases of the respiratory system complicating pregnancy, second trimester: Secondary | ICD-10-CM | POA: Diagnosis not present

## 2017-03-14 DIAGNOSIS — O9989 Other specified diseases and conditions complicating pregnancy, childbirth and the puerperium: Secondary | ICD-10-CM | POA: Diagnosis not present

## 2017-03-14 DIAGNOSIS — R1030 Lower abdominal pain, unspecified: Secondary | ICD-10-CM | POA: Insufficient documentation

## 2017-03-14 DIAGNOSIS — N201 Calculus of ureter: Secondary | ICD-10-CM

## 2017-03-14 DIAGNOSIS — R102 Pelvic and perineal pain: Secondary | ICD-10-CM | POA: Insufficient documentation

## 2017-03-14 DIAGNOSIS — N99528 Other complication of other external stoma of urinary tract: Secondary | ICD-10-CM

## 2017-03-14 DIAGNOSIS — Z3492 Encounter for supervision of normal pregnancy, unspecified, second trimester: Secondary | ICD-10-CM

## 2017-03-14 DIAGNOSIS — O99332 Smoking (tobacco) complicating pregnancy, second trimester: Secondary | ICD-10-CM | POA: Diagnosis not present

## 2017-03-14 HISTORY — PX: OPERATIVE ULTRASOUND: SHX5996

## 2017-03-14 HISTORY — PX: CYSTOSCOPY/URETEROSCOPY/HOLMIUM LASER: SHX6545

## 2017-03-14 HISTORY — PX: CYSTOSCOPY WITH STENT PLACEMENT: SHX5790

## 2017-03-14 LAB — HCG, QUANTITATIVE, PREGNANCY: HCG, BETA CHAIN, QUANT, S: 73638 m[IU]/mL — AB (ref <5)

## 2017-03-14 LAB — CBC
HEMATOCRIT: 37.8 % (ref 35.0–47.0)
HEMOGLOBIN: 12.7 g/dL (ref 12.0–16.0)
MCH: 29.4 pg (ref 26.0–34.0)
MCHC: 33.6 g/dL (ref 32.0–36.0)
MCV: 87.5 fL (ref 80.0–100.0)
Platelets: 459 10*3/uL — ABNORMAL HIGH (ref 150–440)
RBC: 4.32 MIL/uL (ref 3.80–5.20)
RDW: 13.6 % (ref 11.5–14.5)
WBC: 26.3 10*3/uL — ABNORMAL HIGH (ref 3.6–11.0)

## 2017-03-14 LAB — URINALYSIS, COMPLETE (UACMP) WITH MICROSCOPIC
BACTERIA UA: NONE SEEN
BILIRUBIN URINE: NEGATIVE
Glucose, UA: 50 mg/dL — AB
Ketones, ur: NEGATIVE mg/dL
NITRITE: NEGATIVE
PROTEIN: 100 mg/dL — AB
SPECIFIC GRAVITY, URINE: 1.012 (ref 1.005–1.030)
pH: 6 (ref 5.0–8.0)

## 2017-03-14 LAB — COMPREHENSIVE METABOLIC PANEL
ALT: 16 U/L (ref 14–54)
ANION GAP: 12 (ref 5–15)
AST: 33 U/L (ref 15–41)
Albumin: 3.6 g/dL (ref 3.5–5.0)
Alkaline Phosphatase: 72 U/L (ref 38–126)
BUN: 6 mg/dL (ref 6–20)
CHLORIDE: 108 mmol/L (ref 101–111)
CO2: 15 mmol/L — ABNORMAL LOW (ref 22–32)
Calcium: 9 mg/dL (ref 8.9–10.3)
Creatinine, Ser: 0.63 mg/dL (ref 0.44–1.00)
GFR calc Af Amer: >60 mL/min (ref >60)
GFR calc non Af Amer: >60 mL/min (ref >60)
GLUCOSE: 137 mg/dL — AB (ref 65–99)
POTASSIUM: 4.1 mmol/L (ref 3.5–5.1)
SODIUM: 135 mmol/L (ref 135–145)
Total Bilirubin: 0.2 mg/dL — ABNORMAL LOW (ref 0.3–1.2)
Total Protein: 7.7 g/dL (ref 6.5–8.1)

## 2017-03-14 LAB — LIPASE, BLOOD: LIPASE: 22 U/L (ref 11–51)

## 2017-03-14 SURGERY — CYSTOURETEROSCOPY, USING HOLMIUM LASER
Anesthesia: General | Site: Ureter | Laterality: Right | Wound class: Clean Contaminated

## 2017-03-14 MED ORDER — TAMSULOSIN HCL 0.4 MG PO CAPS: 0.4000 mg | ORAL_CAPSULE | Freq: Every day | ORAL | 0 refills | Status: DC

## 2017-03-14 MED ORDER — ONDANSETRON HCL 4 MG/2ML IJ SOLN
4.0000 mg | Freq: Once | INTRAMUSCULAR | Status: AC
Start: 2017-03-14 — End: 2017-03-14
  Administered 2017-03-14: 4 mg via INTRAVENOUS

## 2017-03-14 MED ORDER — ONDANSETRON HCL 4 MG/2ML IJ SOLN: 4.0000 mg | Freq: Once | INTRAMUSCULAR | Status: DC | PRN

## 2017-03-14 MED ORDER — FENTANYL CITRATE (PF) 100 MCG/2ML IJ SOLN
INTRAMUSCULAR | Status: AC
  Filled 2017-03-14: qty 2

## 2017-03-14 MED ORDER — LIDOCAINE HCL (PF) 2 % IJ SOLN
INTRAMUSCULAR | Status: AC
  Filled 2017-03-14: qty 10

## 2017-03-14 MED ORDER — FAMOTIDINE 20 MG PO TABS
ORAL_TABLET | ORAL | Status: AC
  Administered 2017-03-14: 20 mg via ORAL
  Filled 2017-03-14: qty 1

## 2017-03-14 MED ORDER — SODIUM CHLORIDE 0.9 % IV BOLUS (SEPSIS)
1000.0000 mL | Freq: Once | INTRAVENOUS | Status: AC
  Administered 2017-03-14: 1000 mL via INTRAVENOUS

## 2017-03-14 MED ORDER — ACETAMINOPHEN 500 MG PO TABS
1000.0000 mg | ORAL_TABLET | Freq: Once | ORAL | Status: AC
  Administered 2017-03-14: 1000 mg via ORAL
  Filled 2017-03-14: qty 2

## 2017-03-14 MED ORDER — PROPOFOL 10 MG/ML IV BOLUS
INTRAVENOUS | Status: AC
  Filled 2017-03-14: qty 20

## 2017-03-14 MED ORDER — FAMOTIDINE 20 MG PO TABS
ORAL_TABLET | ORAL | Status: AC
  Filled 2017-03-14: qty 1

## 2017-03-14 MED ORDER — DEXAMETHASONE SODIUM PHOSPHATE 10 MG/ML IJ SOLN
INTRAMUSCULAR | Status: DC | PRN
  Administered 2017-03-14: 8 mg via INTRAVENOUS

## 2017-03-14 MED ORDER — OXYCODONE HCL 5 MG PO TABS
5.0000 mg | ORAL_TABLET | ORAL | Status: DC | PRN
  Administered 2017-03-14: 5 mg via ORAL
  Filled 2017-03-14: qty 1

## 2017-03-14 MED ORDER — MORPHINE SULFATE (PF) 2 MG/ML IV SOLN
INTRAVENOUS | Status: AC
  Administered 2017-03-14: 4 mg via INTRAVENOUS
  Filled 2017-03-14: qty 2

## 2017-03-14 MED ORDER — OXYBUTYNIN CHLORIDE 5 MG PO TABS
ORAL_TABLET | ORAL | Status: AC
  Filled 2017-03-14: qty 1

## 2017-03-14 MED ORDER — DEXAMETHASONE SODIUM PHOSPHATE 10 MG/ML IJ SOLN
INTRAMUSCULAR | Status: AC
  Filled 2017-03-14: qty 1

## 2017-03-14 MED ORDER — SODIUM CHLORIDE 0.9 % IV SOLN
INTRAVENOUS | Status: DC
  Administered 2017-03-14: 10:00:00 via INTRAVENOUS

## 2017-03-14 MED ORDER — ONDANSETRON HCL 4 MG/2ML IJ SOLN
INTRAMUSCULAR | Status: DC | PRN
  Administered 2017-03-14: 4 mg via INTRAVENOUS

## 2017-03-14 MED ORDER — PROPOFOL 10 MG/ML IV BOLUS
INTRAVENOUS | Status: DC | PRN
  Administered 2017-03-14: 180 mg via INTRAVENOUS
  Administered 2017-03-14: 20 mg via INTRAVENOUS

## 2017-03-14 MED ORDER — MORPHINE SULFATE (PF) 4 MG/ML IV SOLN
4.0000 mg | INTRAVENOUS | Status: DC | PRN
Start: 2017-03-14 — End: 2017-03-15
  Administered 2017-03-14: 4 mg via INTRAVENOUS
  Filled 2017-03-14: qty 1

## 2017-03-14 MED ORDER — OXYCODONE-ACETAMINOPHEN 5-325 MG PO TABS: 1.0000 | ORAL_TABLET | ORAL | 0 refills | Status: DC | PRN

## 2017-03-14 MED ORDER — SUCCINYLCHOLINE CHLORIDE 20 MG/ML IJ SOLN
INTRAMUSCULAR | Status: DC | PRN
  Administered 2017-03-14: 100 mg via INTRAVENOUS

## 2017-03-14 MED ORDER — OXYBUTYNIN CHLORIDE 5 MG PO TABS: 5.0000 mg | ORAL_TABLET | Freq: Three times a day (TID) | ORAL | 0 refills | Status: DC | PRN

## 2017-03-14 MED ORDER — MORPHINE SULFATE (PF) 4 MG/ML IV SOLN: 4.0000 mg | Freq: Once | INTRAVENOUS | Status: DC

## 2017-03-14 MED ORDER — FENTANYL CITRATE (PF) 100 MCG/2ML IJ SOLN
25.0000 ug | INTRAMUSCULAR | Status: DC | PRN
  Administered 2017-03-14 (×4): 25 ug via INTRAVENOUS

## 2017-03-14 MED ORDER — LIDOCAINE HCL (CARDIAC) 20 MG/ML IV SOLN
INTRAVENOUS | Status: DC | PRN
  Administered 2017-03-14: 80 mg via INTRAVENOUS

## 2017-03-14 MED ORDER — FAMOTIDINE 20 MG PO TABS
20.0000 mg | ORAL_TABLET | Freq: Once | ORAL | Status: AC
  Administered 2017-03-14: 20 mg via ORAL

## 2017-03-14 MED ORDER — FENTANYL CITRATE (PF) 100 MCG/2ML IJ SOLN
INTRAMUSCULAR | Status: DC | PRN
  Administered 2017-03-14 (×4): 50 ug via INTRAVENOUS

## 2017-03-14 MED ORDER — OXYBUTYNIN CHLORIDE 5 MG PO TABS
5.0000 mg | ORAL_TABLET | Freq: Three times a day (TID) | ORAL | Status: AC | PRN
  Administered 2017-03-14: 5 mg via ORAL

## 2017-03-14 MED ORDER — ONDANSETRON HCL 4 MG/2ML IJ SOLN
INTRAMUSCULAR | Status: AC
  Filled 2017-03-14: qty 2

## 2017-03-14 MED ORDER — OXYBUTYNIN CHLORIDE 5 MG PO TABS
5.0000 mg | ORAL_TABLET | Freq: Three times a day (TID) | ORAL | Status: DC
  Administered 2017-03-14: 5 mg via ORAL
  Filled 2017-03-14: qty 1

## 2017-03-14 MED ORDER — PHENYLEPHRINE HCL 10 MG/ML IJ SOLN
INTRAMUSCULAR | Status: DC | PRN
  Administered 2017-03-14 (×3): 100 ug via INTRAVENOUS

## 2017-03-14 MED ORDER — FENTANYL CITRATE (PF) 100 MCG/2ML IJ SOLN
INTRAMUSCULAR | Status: AC
  Administered 2017-03-14: 25 ug via INTRAVENOUS
  Filled 2017-03-14: qty 2

## 2017-03-14 SURGICAL SUPPLY — 29 items
BAG DRAIN CYSTO-URO LG1000N (MISCELLANEOUS) ×6 IMPLANT
BASKET ZERO TIP 1.9FR (BASKET) ×6 IMPLANT
BRUSH SCRUB EZ 1% IODOPHOR (MISCELLANEOUS) ×6 IMPLANT
CATH URETL 5X70 OPEN END (CATHETERS) ×6 IMPLANT
CNTNR SPEC 2.5X3XGRAD LEK (MISCELLANEOUS) ×4
CONRAY 43 FOR UROLOGY 50M (MISCELLANEOUS) ×6 IMPLANT
CONT SPEC 4OZ STER OR WHT (MISCELLANEOUS) ×2
CONTAINER SPEC 2.5X3XGRAD LEK (MISCELLANEOUS) ×4 IMPLANT
DRAPE UTILITY 15X26 TOWEL STRL (DRAPES) ×6 IMPLANT
FIBER LASER LITHO 273 (Laser) ×6 IMPLANT
GLOVE BIO SURGEON STRL SZ 6.5 (GLOVE) ×10 IMPLANT
GLOVE BIO SURGEONS STRL SZ 6.5 (GLOVE) ×2
GOWN STRL REUS W/ TWL LRG LVL3 (GOWN DISPOSABLE) ×8 IMPLANT
GOWN STRL REUS W/TWL LRG LVL3 (GOWN DISPOSABLE) ×4
GUIDEWIRE GREEN .038 145CM (MISCELLANEOUS) ×6 IMPLANT
INFUSOR MANOMETER BAG 3000ML (MISCELLANEOUS) ×6 IMPLANT
INTRODUCER DILATOR DOUBLE (INTRODUCER) ×6 IMPLANT
KIT TURNOVER CYSTO (KITS) ×6 IMPLANT
PACK CYSTO AR (MISCELLANEOUS) ×6 IMPLANT
SENSORWIRE 0.038 NOT ANGLED (WIRE) ×6
SET CYSTO W/LG BORE CLAMP LF (SET/KITS/TRAYS/PACK) ×6 IMPLANT
SHEATH URETERAL 12FRX35CM (MISCELLANEOUS) IMPLANT
SOL .9 NS 3000ML IRR  AL (IV SOLUTION) ×2
SOL .9 NS 3000ML IRR UROMATIC (IV SOLUTION) ×4 IMPLANT
STENT URET 6FRX24 CONTOUR (STENTS) ×6 IMPLANT
STENT URET 6FRX26 CONTOUR (STENTS) IMPLANT
SURGILUBE 2OZ TUBE FLIPTOP (MISCELLANEOUS) ×6 IMPLANT
WATER STERILE IRR 1000ML POUR (IV SOLUTION) ×6 IMPLANT
WIRE SENSOR 0.038 NOT ANGLED (WIRE) ×4 IMPLANT

## 2017-03-14 NOTE — Discharge Instructions (Signed)

## 2017-03-14 NOTE — Anesthesia Preprocedure Evaluation (Addendum)
Anesthesia Evaluation  Patient identified by MRN, date of birth, ID band Patient awake    Reviewed: Allergy & Precautions, NPO status , Patient's Chart, lab work & pertinent test results  Airway Mallampati: II  TM Distance: >3 FB     Dental  (+) Teeth Intact, Chipped   Pulmonary asthma , Current Smoker,    Pulmonary exam normal        Cardiovascular negative cardio ROS Normal cardiovascular exam     Neuro/Psych negative neurological ROS  negative psych ROS   GI/Hepatic negative GI ROS, Neg liver ROS,   Endo/Other  negative endocrine ROS  Renal/GU      Musculoskeletal negative musculoskeletal ROS (+)   Abdominal Normal abdominal exam  (+)   Peds negative pediatric ROS (+)  Hematology negative hematology ROS (+)   Anesthesia Other Findings   Reproductive/Obstetrics (+) Pregnancy                            Anesthesia Physical Anesthesia Plan  ASA: II  Anesthesia Plan: General   Post-op Pain Management:    Induction: Intravenous  PONV Risk Score and Plan:   Airway Management Planned: Oral ETT  Additional Equipment:   Intra-op Plan:   Post-operative Plan:   Informed Consent: I have reviewed the patients History and Physical, chart, labs and discussed the procedure including the risks, benefits and alternatives for the proposed anesthesia with the patient or authorized representative who has indicated his/her understanding and acceptance.   Dental advisory given  Plan Discussed with: CRNA and Surgeon  Anesthesia Plan Comments: (No suggamadex, nitrous or versed.)        Anesthesia Quick Evaluation

## 2017-03-14 NOTE — ED Triage Notes (Signed)
Pt had nephrostomy tube removed and uretal stent placed today and she started with severe pain lower abd pain 1 hour ago
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
## 2017-03-14 NOTE — Transfer of Care (Signed)
Immediate Anesthesia Transfer of Care Note  Patient: Courtney Burnett Fluellen  Procedure(s) Performed: CYSTOSCOPY/URETEROSCOPY/HOLMIUM LASER (Right Ureter) NEPHROSTOMY TUBE REMOVAL (Right Back) CYSTOSCOPY WITH STENT PLACEMENT (Right Ureter)  Patient Location: PACU  Anesthesia Type:General  Level of Consciousness: awake and patient cooperative  Airway & Oxygen Therapy: Patient Spontanous Breathing and Patient connected to nasal cannula oxygen  Post-op Assessment: Report given to RN and Post -op Vital signs reviewed and stable  Post vital signs: Reviewed and stable  Last Vitals:  Vitals:   03/14/17 1310  BP: (!) 147/82  Pulse: (!) 106  Resp: (!) 22  Temp: 37.1 C  SpO2: 100%    Last Pain:  Vitals:   03/14/17 0941  PainSc: 5          Complications: No apparent anesthesia complications

## 2017-03-14 NOTE — Interval H&P Note (Signed)
History and Physical Interval Note:  03/14/2017 10:48 AM  Courtney Burnett  has presented today for surgery, with the diagnosis of right UPJ stone  The various methods of treatment have been discussed with the patient and family. After consideration of risks, benefits and other options for treatment, the patient has consented to  Procedure(s): CYSTOSCOPY/URETEROSCOPY/HOLMIUM LASER/STENT PLACEMENT (Right) NEPHROSTOMY TUBE REMOVAL (Right) as a surgical intervention .  The patient's history has been reviewed, patient examined, no change in status, stable for surgery.  I have reviewed the patient's chart and labs.  Questions were answered to the patient's satisfaction.    RRR CTAB   Vanna ScotlandAshley Kaleigha Chamberlin

## 2017-03-14 NOTE — ED Notes (Signed)
Pt states she has been urinating every few minutes at home.

## 2017-03-14 NOTE — Anesthesia Procedure Notes (Signed)
Procedure Name: Intubation Date/Time: 03/14/2017 11:22 AM Performed by: Eben Burow, CRNA Pre-anesthesia Checklist: Patient identified, Emergency Drugs available, Suction available, Patient being monitored and Timeout performed Patient Re-evaluated:Patient Re-evaluated prior to induction Oxygen Delivery Method: Circle system utilized Preoxygenation: Pre-oxygenation with 100% oxygen Induction Type: IV induction Ventilation: Mask ventilation without difficulty Laryngoscope Size: Mac and 3 Grade View: Grade I Tube type: Oral Tube size: 7.0 mm Number of attempts: 1 Airway Equipment and Method: Stylet Placement Confirmation: ETT inserted through vocal cords under direct vision,  positive ETCO2 and breath sounds checked- equal and bilateral Secured at: 22 cm Tube secured with: Tape Dental Injury: Teeth and Oropharynx as per pre-operative assessment

## 2017-03-14 NOTE — Progress Notes (Signed)
[facility]  Small amt of bloody urine    FHT done earlier  Baby a 129  Baby b 154

## 2017-03-14 NOTE — Anesthesia Post-op Follow-up Note (Signed)
Anesthesia QCDR form completed.        

## 2017-03-14 NOTE — ED Notes (Signed)
Pt arrives to treatment room in tears. Given tissues. States had nephrostomy tube removed today and uretal stent placed. Denies problems after surgery. States went home and ate. Pt is holding lower abdomen. States "I guess I'm cramping." pt states small amount of blood, thinks it's d/t surgery. [redacted] weeks pregnant, sees [facility] for OB. States hasn't felt babies move today. Sees Dr. Apolinar JunesBrandon for urology.

## 2017-03-14 NOTE — Op Note (Signed)
Date of procedure: 03/14/17  Preoperative diagnosis:  1. Right UPJ stone   Postoperative diagnosis:  1. same   Procedure: 1. Right ureteroscopy with laser lithotripsy 2. Intraoperative renal ultrasound 3. Right nephrostomy tube removal  Surgeon: Vanna Scotland, MD  Anesthesia: General  Complications: None  Intraoperative findings: Large, at least 15 mm right UPJ stone treated.  Nephrostomy tube removed.  Stent placed under ultrasound guidance.  No fluoroscopy utilized during procedure.   EBL: Minimal  Specimens: Stone fragment  Drains: 6 x 24 French double-J ureteral stent on right  Indication: Courtney Burnett is a 27 y.o. patient is currently approximately [redacted] weeks pregnant with di-di-twins with a large symptomatic right UPJ stone managed with a right-sided nephrostomy tube during first trimester.  After careful consideration, she is elected to proceed with ureteroscopy for definitive management of her stone during the second trimester.  After reviewing the management options for treatment, she elected to proceed with the above surgical procedure(s). We have discussed the potential benefits and risks of the procedure, side effects of the proposed treatment, the likelihood of the patient achieving the goals of the procedure, and any potential problems that might occur during the procedure or recuperation including the risk of pregnancy loss. Informed consent has been obtained.  Description of procedure:  The patient was taken to the operating room and general anesthesia was induced.  The patient was placed in the dorsal lithotomy position, prepped and draped in the usual sterile fashion, and preoperative antibiotics were administered. A preoperative time-out was performed.   A 21 French scope was advanced per urethra into the bladder.  Attention was turned to the right ureteral orifice.  At this point time, a wire was advanced up to the level of the kidney.  This was visualized  coming into the renal pelvis utilizing intraoperative renal ultrasound.  No fluoroscopy was used during the procedure given her gravid state.  A second Super Stiff wire was introduced up to level of the kidney.  The stones out of could easily be seen on the renal ultrasound as well as her nephrostomy tube curled in the renal pelvis.  At this point time, a 7 [place] single channel will for flexible ureteroscope was advanced over the Super Stiff wire up to the level of the kidney under ultrasound guidance.  Was in the kidney, the renal pelvis was visualized.  The nephrostomy tube was seen with a curl within the renal pelvis.  A large at least 15 mm UPJ stone was identified.  There was edema noted at the level of the UPJ.  At this point time, 270 m laser fiber was then brought in and using the settings of 0.2 J and 40 Hz, the stone was dusted into very fine dustlike material.  Given the size of the stone, there is a significant amount of debris.  Once all of the stone burden was fragmented, the nephrostomy tube was irrigated multiple times with 30 cc at a time in order to clear all the debris from her kidney.  Each every calyx was then directly visualized.  There is no residual significant stone burden at the end of the procedure.  Nephrostomy tube was then removed under direct visualization.  The ureteroscope was then backed down the length of the ureter inspecting along the way.  Once all stone fragment was identified within the mid ureter and a 1.9 French tipless nitinol basket was used to extract the small stone.  Finally, a 6 x 24 [place]  double-J ureteral stent was advanced over the safety wire up to level the renal pelvis again confirmed by bedside renal ultrasound.  The wire was partially withdrawn and a full coil was confirmed within the renal pelvis on ultrasound.  The wire was then fully withdrawn and a full coils noted within the bladder.  The bladder was then drained.  A dressing of a Tegaderm and 4 x 4 was  applied to her right flank.  The patient was then cleaned and dried, repositioned in the supine position, reversed from anesthesia, and taken to the PACU in stable condition.  Plan:  Plan: Fetal heart tones will be checked in the postoperative holding area.  She will return next week for cystoscopy, stent removal.  She should maintain her dressing on for 2 days which can subsequently be removed.  The intra-operative and postoperative course was discussed with her husband.  All questions were answered.  Vanna ScotlandAshley Coren Crownover, M.D.

## 2017-03-14 NOTE — ED Notes (Signed)
Dr. Roxan Hockeyobinson at bedside to do bedside ultrasound.

## 2017-03-14 NOTE — Anesthesia Postprocedure Evaluation (Signed)
Anesthesia Post Note  Patient: Courtney Burnett  Procedure(s) Performed: CYSTOSCOPY/URETEROSCOPY/HOLMIUM LASER (Right Ureter) NEPHROSTOMY TUBE REMOVAL (Right Back) CYSTOSCOPY WITH STENT PLACEMENT (Right Ureter)  Patient location during evaluation: PACU Anesthesia Type: General Level of consciousness: awake and alert and oriented Pain management: pain level controlled Vital Signs Assessment: post-procedure vital signs reviewed and stable Respiratory status: spontaneous breathing Cardiovascular status: blood pressure returned to baseline Anesthetic complications: no Comments: FHRs pre and postop     Last Vitals:  Vitals:   03/14/17 1405 03/14/17 1412  BP: (!) 143/86 132/90  Pulse: (!) 105 (!) 105  Resp: (!) 22 16  Temp:  (!) 36.3 C  SpO2: 99% 100%    Last Pain:  Vitals:   03/14/17 1412  TempSrc: Temporal  PainSc:                  Courtney Burnett

## 2017-03-14 NOTE — OR Nursing (Signed)
L&D nurse came to SDS to obtain fetal heart tones by doppler before going back to OR Baby A 164 Baby B 150

## 2017-03-14 NOTE — Discharge Instructions (Signed)
You have a ureteral stent in place.  This is a tube that extends from your kidney to your bladder.  This may cause urinary bleeding, burning with urination, and urinary frequency.  Please call our office or present to the ED if you develop fevers >101 or pain which is not able to be controlled with oral pain medications.  You may be given either Flomax and/ or ditropan to help with bladder spasms and stent pain in addition to pain medications.    [facility] [address], [address] [place], [place] [address] [phone] 

## 2017-03-15 ENCOUNTER — Encounter: Payer: Self-pay | Admitting: Urology

## 2017-03-15 ENCOUNTER — Telehealth: Payer: Self-pay

## 2017-03-15 NOTE — Telephone Encounter (Signed)
Pt called stating she is having severe right sided abd pain and the pain meds given at the time of surgery are not working. Per Dr. Apolinar JunesBrandon due to pregnancy and pain meds previously given there is nothing else that can be given. Encouraged pt to apply a heating pad to area and increase fluid in take. Reinforced with pt no tub baths due to stent. Pt voiced understanding.

## 2017-03-16 ENCOUNTER — Telehealth: Payer: Self-pay

## 2017-03-16 ENCOUNTER — Other Ambulatory Visit: Payer: Self-pay | Admitting: Urology

## 2017-03-16 DIAGNOSIS — N99528 Other complication of other external stoma of urinary tract: Secondary | ICD-10-CM

## 2017-03-16 MED ORDER — OXYCODONE-ACETAMINOPHEN 5-325 MG PO TABS: 1.0000 | ORAL_TABLET | ORAL | 0 refills | Status: DC | PRN

## 2017-03-16 NOTE — Telephone Encounter (Signed)
Pt husband called again stating they have tried the heating pad and tylenol without relief for pt. Per Dr. Apolinar JunesBrandon pt can have 10 tabs of oxycodone that have to last her all weekend and no more refills will be given. Husband voiced understanding. Script left up front.

## 2017-03-16 NOTE — Telephone Encounter (Signed)
Pt husband called stating pt is now out of oxycodone and continues to be in a lot of pain. Reinforced with husband pt to utilize tylenol and can apply heat to the area. Reinforced with husband pt should not get in a tub bath with stent in place. Husband again requested more oxycodone for pt. Reinforced with husband no more oxycodone would be given by [facility] as pt was given some on Wednesday. Encouraged husband to have pt utilize heating pad. Husband then requested prednisone for pain. Reinforced with husband prednisone is not recommended to pain. Husband inquired about taking pt to ER. Reinforced with husband it would be his and pt decision if they wanted to seek tx at the ER. Husband voiced understanding of whole conversation.

## 2017-03-19 ENCOUNTER — Telehealth: Payer: Self-pay

## 2017-03-19 LAB — STONE ANALYSIS
Ca Hydrogen Phos.: 65 %
Ca Oxalate,Dihydrate: 10 %
Ca Oxalate,Monohydr.: 5 %
Ca phos cry stone ql IR: 20 %
Stone Weight KSTONE: 8.9 mg

## 2017-03-19 NOTE — Telephone Encounter (Signed)
[facility]  Per Dr. Apolinar JunesBrandon pt can come this afternoon to have stent removed or will need to keep her appt for Friday.

## 2017-03-20 ENCOUNTER — Other Ambulatory Visit: Payer: Self-pay | Admitting: Obstetrics & Gynecology

## 2017-03-20 ENCOUNTER — Ambulatory Visit (INDEPENDENT_AMBULATORY_CARE_PROVIDER_SITE_OTHER): Payer: Medicaid Other

## 2017-03-20 ENCOUNTER — Ambulatory Visit (INDEPENDENT_AMBULATORY_CARE_PROVIDER_SITE_OTHER): Payer: Medicaid Other | Admitting: Obstetrics and Gynecology

## 2017-03-20 VITALS — BP 142/72 | Wt 223.0 lb

## 2017-03-20 DIAGNOSIS — O30042 Twin pregnancy, dichorionic/diamniotic, second trimester: Secondary | ICD-10-CM

## 2017-03-20 DIAGNOSIS — Z363 Encounter for antenatal screening for malformations: Secondary | ICD-10-CM

## 2017-03-20 DIAGNOSIS — Z3A18 18 weeks gestation of pregnancy: Secondary | ICD-10-CM

## 2017-03-20 DIAGNOSIS — Z0489 Encounter for examination and observation for other specified reasons: Secondary | ICD-10-CM

## 2017-03-20 DIAGNOSIS — IMO0002 Reserved for concepts with insufficient information to code with codable children: Secondary | ICD-10-CM

## 2017-03-20 DIAGNOSIS — O0990 Supervision of high risk pregnancy, unspecified, unspecified trimester: Secondary | ICD-10-CM

## 2017-03-20 NOTE — Progress Notes (Signed)
Courtney Burnett, [facility]Anatomy [facility] today[facility]

## 2017-03-21 ENCOUNTER — Inpatient Hospital Stay: Admission: RE | Admit: 2017-03-21 | Payer: Medicaid Other | Source: Ambulatory Visit

## 2017-03-21 NOTE — Progress Notes (Signed)
Routine Prenatal Care Visit  Subjective  Courtney Burnett is a 27 y.o. G1P0 at [redacted]w[redacted]d being seen today for ongoing prenatal care.  She is currently monitored for the following issues for this high-risk pregnancy and has Supervision of high risk pregnancy, antepartum; Right ureteral stone; Dichorionic diamniotic twin gestation; and Flank pain on their problem list.  ----------------------------------------------------------------------------------- Patient reports no complaints.   Contractions: Not present. Vag. Bleeding: None.  Movement: Present. Denies leaking of fluid.  ----------------------------------------------------------------------------------- The following portions of the patient's history were reviewed and updated as appropriate: allergies, current medications, past family history, past medical history, past social history, past surgical history and problem list. Problem list updated.   Objective  Blood pressure (!) 142/72, weight 223 lb (101.2 kg), last menstrual period 11/13/2016. Pregravid weight 205 lb (93 kg) Total Weight Gain 18 lb (8.165 kg) Urinalysis: Urine Protein: 1+ Urine Glucose: Negative  Fetal Status: Fetal Heart Rate (bpm): 140   Movement: Present     General:  Alert, oriented and cooperative. Patient is in no acute distress.  Skin: Skin is warm and dry. No rash noted.   Cardiovascular: Normal heart rate noted  Respiratory: Normal respiratory effort, no problems with respiration noted  Abdomen: Soft, gravid, appropriate for gestational age. Pain/Pressure: Present     Pelvic:  Cervical exam deferred        Extremities: Normal range of motion.     ental Status: Normal mood and affect. Normal behavior. Normal judgment and thought content.   US Ob Comp + 14 Wk  Result Date: 03/20/2017 ULTRASOUND REPORT Location: [facility] Date of Service: 03/20/2017 Indications:Anatomy U/S Findings: Dichorionic/ Diamniotic Twin intrauterine pregnancy is visualized. Baby  A: FHR at 140 BPM. Biometrics give an (U/S) Gestational age of [redacted]w[redacted]d and an (U/S) EDD of 08/22/2017; this correlates with the clinically established Estimated Date of Delivery: 08/20/17 Fetal presentation is Breech, Lower gestation EFW: 7oz. Placenta: Posterior, grade 0. AFI: subjectively normal. Anatomic survey is incomplete for upper and lower ext, face, cardiac views and diaphragm d/t fetal position and early gestational age; Gender - female.  Baby B: FHR at 129 BPM. Biometrics give an (U/S) Gestational age of [redacted]w[redacted]d and an (U/S) EDD of 08/23/2017; this correlates with the clinically established Estimated Date of Delivery: 08/20/17 Fetal presentation is Breech, Lower gestation EFW: 7oz. Placenta: Posterior, grade 0. AFI: subjectively normal. Anatomic survey is incomplete for upper and lower ext, face, cardiac views and diaphragm d/t fetal position and early gestational age; Gender - female.  Right Ovary is normal in appearance. Left Ovary is normal appearance. Survey of the adnexa demonstrates no adnexal masses. There is no free peritoneal fluid in the cul de sac. Impression: 1. [redacted]w[redacted]d Viable Di/Di Twin Intrauterine pregnancy by U/S. 2. (U/S) EDD is consistent with Clinically established Estimated Date of Delivery: 08/20/17 . 3. Normal Anatomy Scan Recommendations: 1.Clinical correlation with the patient's History and Physical Exam. 2. Follow up in 3-4 weeks to complete anatomic surveys. (Please place 2 orders) Willette Alma, RDMS, RVT  There is a diamniotic, dichorionic twin gestation with subjectively normal amniotic fluid volume. The fetal biometry correlates with established dating. Detailed evaluation of the fetal anatomy was performed.The fetal anatomical survey appears within normal limits within the resolution of ultrasound as described above, but remains incomplete.  Follow up ultrasound in 2-4 weeks for completion of anatomic survey is recommended.  It must be noted that a normal ultrasound is unable to rule  out fetal aneuploidy.  Vena Austria,  MD [facility], [facility]    Assessment   27 y.o. G1P0 at 349w2d by  08/20/2017, by Last Menstrual Period presenting for routine prenatal visit  Plan   Pregnancy #1 Problems (from 01/23/17 to present)    Problem Noted Resolved   Dichorionic diamniotic twin gestation 01/25/2017 by Vena AustriaStaebler, Keigan Girten, MD No   Overview Signed 02/02/2017  9:26 AM by Vena AustriaStaebler, Nieves Barberi, MD    [ ]  Monthly growth scan after 20 week      Supervision of high risk pregnancy, antepartum 01/23/2017 by Tresea MallGledhill, Jane, CNM No   Overview Addendum 02/07/2017  1:12 PM by Vena AustriaStaebler, Malak Duchesneau, MD    Clinic [facility] Prenatal Labs (drawn 02/01/17)  Dating LMP = 10 week US Blood type: O positive  Genetic Screen 1 Screen: Negative Antibody:negative  Anatomic US  Rubella: Immune Varicella: Immune  GTT Early:               Third trimester:  RPR: NR  Rhogam  HBsAg: negative  TDaP vaccine                       Flu Shot: HIV: negative  Baby Food                                GBS:   Contraception  Pap:  CBB     CS/VBAC NA   Support Person Husband Brandon              Preterm labor symptoms and general obstetric precautions including but not limited to vaginal bleeding, contractions, leaking of fluid and fetal movement were reviewed in detail with the patient. Please refer to After Visit Summary for other counseling recommendations.  - percutaneous nephrostomy tube has been removed, stent to be removed in the next week - anatomy scan incomplete follow up in 4 weeks  Return in about 4 weeks (around 04/17/2017) for ROB and follow up anatomy scan (TWINS!!!).

## 2017-03-23 ENCOUNTER — Ambulatory Visit (INDEPENDENT_AMBULATORY_CARE_PROVIDER_SITE_OTHER): Payer: Medicaid Other | Admitting: Urology

## 2017-03-23 ENCOUNTER — Encounter: Payer: Self-pay | Admitting: Obstetrics and Gynecology

## 2017-03-23 ENCOUNTER — Encounter: Payer: Self-pay | Admitting: Urology

## 2017-03-23 VITALS — BP 144/77 | HR 96

## 2017-03-23 DIAGNOSIS — N200 Calculus of kidney: Secondary | ICD-10-CM | POA: Diagnosis not present

## 2017-03-23 LAB — URINALYSIS, COMPLETE
Bilirubin, UA: NEGATIVE
GLUCOSE, UA: NEGATIVE
KETONES UA: NEGATIVE
Nitrite, UA: NEGATIVE
Urobilinogen, Ur: 0.2 mg/dL (ref 0.2–1.0)
pH, UA: 7 (ref 5.0–7.5)

## 2017-03-23 LAB — MICROSCOPIC EXAMINATION
Epithelial Cells (non renal): >10 /hpf — ABNORMAL HIGH (ref 0–10)
RBC, UA: >30 /hpf — ABNORMAL HIGH (ref 0–?)
WBC, UA: NONE SEEN /hpf (ref 0–?)

## 2017-03-23 MED ORDER — LIDOCAINE HCL 2 % EX GEL
1.0000 "application " | Freq: Once | CUTANEOUS | Status: AC
  Administered 2017-03-23: 1 via URETHRAL

## 2017-03-23 MED ORDER — CIPROFLOXACIN HCL 500 MG PO TABS
500.0000 mg | ORAL_TABLET | Freq: Once | ORAL | Status: AC
  Administered 2017-03-23: 500 mg via ORAL

## 2017-03-23 NOTE — Progress Notes (Signed)
   03/24/17  CC:  Chief Complaint  Patient presents with  . Cysto Stent Removal    HPI: 27 year old female [redacted] weeks pregnant with di-di-twins who underwent ultrasound-guided ureteroscopy on the right last week for a large 1.5 cm obstructing renal pelvis calculus.  She initially had issues with pain control with the stent but over the past several days, this is improved dramatically.  No fevers or chills.  She is anxious to have her stent removed today.  Blood pressure (!) 144/77, pulse 96, last menstrual period 11/13/2016. NED. A&Ox3.   No respiratory distress   Abd soft, NT, ND Normal external genitalia with patent urethral meatus  Cystoscopy/ Stent removal procedure  Patient identification was confirmed, informed consent was obtained, and patient was prepped using Betadine solution.  Lidocaine jelly was administered per urethral meatus.    Preoperative abx where received prior to procedure.   Procedure: - Flexible cystoscope introduced, without any difficulty.   - Thorough search of the bladder revealed:    normal urethral meatus  Stent seen emanating from right ureteral orifice, grasped with stent graspers, and removed in entirety.    Post-Procedure: - Patient tolerated the procedure well   Assessment/ Plan:  1. Nephrolithiasis S/p R URS Stent removed today Warning symptoms reviewed Plan for f/u RUS in 4 weeks, anticipate residual physiologic right hydrocele but will assess for residual stone burden - Urinalysis, Complete - ciprofloxacin (CIPRO) tablet 500 mg - lidocaine (XYLOCAINE) 2 % jelly 1 application - US RENAL; Future  Return in about 4 weeks (around 04/20/2017) for RUS.   Vanna ScotlandAshley Legrand Lasser, MD

## 2017-04-17 ENCOUNTER — Other Ambulatory Visit: Payer: Self-pay | Admitting: Obstetrics and Gynecology

## 2017-04-17 ENCOUNTER — Ambulatory Visit (INDEPENDENT_AMBULATORY_CARE_PROVIDER_SITE_OTHER): Payer: Medicaid Other

## 2017-04-17 ENCOUNTER — Ambulatory Visit (INDEPENDENT_AMBULATORY_CARE_PROVIDER_SITE_OTHER): Payer: Medicaid Other | Admitting: Obstetrics & Gynecology

## 2017-04-17 VITALS — BP 140/80 | Wt 227.0 lb

## 2017-04-17 DIAGNOSIS — Z3A18 18 weeks gestation of pregnancy: Secondary | ICD-10-CM | POA: Diagnosis not present

## 2017-04-17 DIAGNOSIS — O0990 Supervision of high risk pregnancy, unspecified, unspecified trimester: Secondary | ICD-10-CM

## 2017-04-17 DIAGNOSIS — IMO0002 Reserved for concepts with insufficient information to code with codable children: Secondary | ICD-10-CM

## 2017-04-17 DIAGNOSIS — Z0489 Encounter for examination and observation for other specified reasons: Secondary | ICD-10-CM

## 2017-04-17 DIAGNOSIS — O30042 Twin pregnancy, dichorionic/diamniotic, second trimester: Secondary | ICD-10-CM

## 2017-04-17 DIAGNOSIS — Z3A22 22 weeks gestation of pregnancy: Secondary | ICD-10-CM

## 2017-04-17 NOTE — Progress Notes (Signed)
  Subjective  Fetal Movement? yes Contractions? no Leaking Fluid? no Vaginal Bleeding? No Twins, no c/os.  No kidney concerns today  Objective  BP 140/80   Wt 227 lb (103 kg)   LMP 11/13/2016 Comment: signed appropriate consent  BMI 38.96 kg/m  General: NAD Pumonary: no increased work of breathing Abdomen: gravid, non-tender Extremities: no edema Psychiatric: mood appropriate, affect full  Assessment  27 y.o. G1P0 at 5315w1d by  08/20/2017, by Last Menstrual Period presenting for routine prenatal visit  Plan   Problem List Items Addressed This Visit      Other   Supervision of high risk pregnancy, antepartum   Dichorionic diamniotic twin gestation    Other Visit Diagnoses    [redacted] weeks gestation of pregnancy    -  Primary    Review of ULTRASOUND. I have personally reviewed images and [facility] There is a singleton gestation with subjectively normal amniotic fluid volume. The fetal biometry correlates with established dating. Detailed evaluation of the fetal anatomy was performed.The fetal anatomical survey appears within normal limits within the resolution of ultrasound as described above.  It must be noted that a normal ultrasound is unable to rule out fetal aneuploidy.   Twins RF discussed.  Annamarie MajorPaul Harris, MD, Merlinda FrederickFACOG [facility], [facility] 04/17/2017  3:40 PM

## 2017-04-17 NOTE — Patient Instructions (Signed)
Multiple Pregnancy Having a multiple pregnancy means that a woman is carrying more than one baby at a time. She may be pregnant with twins, triplets, or more. The majority of multiple pregnancies are twins. Naturally conceiving triplets or more (higher-order multiples) is rare. Multiple pregnancies are riskier than single pregnancies. A woman with a multiple pregnancy is more likely to have certain problems during her pregnancy. Therefore, she will need to have more frequent appointments for prenatal care. How does a multiple pregnancy happen? A multiple pregnancy happens when:  The woman's body releases more than one egg at a time, and then each egg gets fertilized by a different sperm. ? This is the most common type of multiple pregnancy. ? Twins or other multiples produced this way are fraternal. They are no more alike than non-multiple siblings are.  One sperm fertilizes one egg, which then divides into more than one embryo. ? Twins or other multiples produced this way are identical. Identical multiples are always the same gender, and they look very much alike.  Who is most likely to have a multiple pregnancy? A multiple pregnancy is more likely to develop in women who:  Have had fertility treatment, especially if the treatment included fertility drugs.  Are older than 27 years of age.  Have already had four or more children.  Have a family history of multiple pregnancy.  How is a multiple pregnancy diagnosed? A multiple pregnancy may be diagnosed based on:  Symptoms such as: ? Rapid weight gain in the first 3 months of pregnancy (first trimester). ? More severe nausea and breast tenderness than what is typical of a single pregnancy. ? The uterus measuring larger than what is normal for the stage of the pregnancy.  Blood tests that detect a higher-than-normal level of human chorionic gonadotropin (hCG). This is a hormone that your body produces in early pregnancy.  Ultrasound  exam. This is used to confirm that you are carrying multiples.  What risks are associated with multiple pregnancy? A multiple pregnancy puts you at a higher risk for certain problems during or after your pregnancy, including:  Having your babies delivered before you have reached a full-term pregnancy (preterm birth). A full-term pregnancy lasts for at least 37 weeks. Babies born before 53 weeks may have a higher risk of a variety of health problems, such as breathing problems, feeding difficulties, cerebral palsy, and learning disabilities.  Diabetes.  Preeclampsia. This is a serious condition that causes high blood pressure along with other symptoms, such as swelling and headaches, during pregnancy.  Excessive blood loss after childbirth (postpartum hemorrhage).  Postpartum depression.  Low birth weight of the babies.  How will having a multiple pregnancy affect my care? Your health care provider will want to monitor you more closely during your pregnancy to make sure that your babies are growing normally and that you are healthy. Follow these instructions at home: Because your pregnancy is considered to be high risk, you will need to work closely with your health care team. You may also need to make some lifestyle changes. These may include the following: Eating and drinking  Increase your nutrition. ? Follow your health care provider's recommendations for weight gain. You may need to gain a little extra weight when you are pregnant with multiples. ? Eat healthy snacks often throughout the day. This can add calories and reduce nausea.  Drink enough fluid to keep your urine clear or pale yellow.  Take prenatal vitamins. Activity By 20-24 weeks, you may  need to limit your activities.  Avoid activities and work that take a lot of effort (are strenuous).  Ask your health care provider when you should stop having sexual intercourse.  Rest often.  General instructions  Do not use  any products that contain nicotine or tobacco, such as cigarettes and e-cigarettes. If you need help quitting, ask your health care provider.  Do not drink alcohol or use illegal drugs.  Take over-the-counter and prescription medicines only as told by your health care provider.  Arrange for extra help around the house.  Keep all follow-up visits and all prenatal visits as told by your health care provider. This is important. Contact a health care provider if:  You have dizziness.  You have persistent nausea, vomiting, or diarrhea.  You are having trouble gaining weight.  You have feelings of depression or other emotions that are interfering with your normal activities. Get help right away if:  You have a fever.  You have pain with urination.  You have fluid leaking from your vagina.  You have a bad-smelling vaginal discharge.  You notice increased swelling in your face, hands, legs, or ankles.  You have spotting or bleeding from your vagina.  You have pelvic cramps, pelvic pressure, or nagging pain in your abdomen or lower back.  You are having regular contractions.  You develop a severe headache, with or without visual changes.  You have shortness of breath or chest pain.  You notice less fetal movement, or no fetal movement. This information is not intended to replace advice given to you by your health care provider. Make sure you discuss any questions you have with your health care provider. Document Released: 11/09/2007 Document Revised: 10/01/2015 Document Reviewed: 10/01/2015 Elsevier Interactive Patient Education  2018 Elsevier Inc.  

## 2017-04-20 ENCOUNTER — Ambulatory Visit
Admission: RE | Admit: 2017-04-20 | Discharge: 2017-04-20 | Disposition: A | Discharge status: Home / Self Care | Payer: Medicaid Other | Source: Ambulatory Visit | Attending: Urology | Admitting: Urology

## 2017-04-20 ENCOUNTER — Other Ambulatory Visit: Payer: Self-pay | Admitting: Obstetrics & Gynecology

## 2017-04-20 DIAGNOSIS — N200 Calculus of kidney: Secondary | ICD-10-CM | POA: Diagnosis present

## 2017-04-20 DIAGNOSIS — N1330 Unspecified hydronephrosis: Secondary | ICD-10-CM | POA: Diagnosis not present

## 2017-04-20 DIAGNOSIS — O30042 Twin pregnancy, dichorionic/diamniotic, second trimester: Secondary | ICD-10-CM

## 2017-04-22 ENCOUNTER — Emergency Department
Admission: EM | Admit: 2017-04-22 | Discharge: 2017-04-22 | Disposition: A | Discharge status: Home / Self Care | Payer: Medicaid Other | Attending: Emergency Medicine | Admitting: Emergency Medicine

## 2017-04-22 ENCOUNTER — Other Ambulatory Visit: Payer: Self-pay

## 2017-04-22 ENCOUNTER — Emergency Department: Payer: Medicaid Other

## 2017-04-22 DIAGNOSIS — O1202 Gestational edema, second trimester: Secondary | ICD-10-CM | POA: Insufficient documentation

## 2017-04-22 DIAGNOSIS — Z5321 Procedure and treatment not carried out due to patient leaving prior to being seen by health care provider: Secondary | ICD-10-CM | POA: Insufficient documentation

## 2017-04-22 DIAGNOSIS — Z3A24 24 weeks gestation of pregnancy: Secondary | ICD-10-CM | POA: Diagnosis not present

## 2017-04-22 NOTE — ED Notes (Signed)
Patient called three times for treatment area. No answer.

## 2017-04-22 NOTE — ED Notes (Signed)
Spoke to Dr. Don PerkingVeronese about pt presentation. Orders for US at this time. She denied any blood needing to be drawn at this time.

## 2017-04-22 NOTE — ED Triage Notes (Signed)
Pt arrives to ED stating that R thigh is numb, swollen, and purple. Denies injury. Denies blood thinner use. States 24 weeks and 6 days pregnant and is having twins. No bruising noted to this RN. Denies pregnancy c/o.

## 2017-04-23 ENCOUNTER — Ambulatory Visit (INDEPENDENT_AMBULATORY_CARE_PROVIDER_SITE_OTHER): Payer: Medicaid Other | Admitting: Obstetrics and Gynecology

## 2017-04-23 ENCOUNTER — Telehealth: Payer: Self-pay | Admitting: Emergency Medicine

## 2017-04-23 VITALS — BP 136/68 | Wt 228.0 lb

## 2017-04-23 DIAGNOSIS — Z3A23 23 weeks gestation of pregnancy: Secondary | ICD-10-CM

## 2017-04-23 DIAGNOSIS — O0990 Supervision of high risk pregnancy, unspecified, unspecified trimester: Secondary | ICD-10-CM

## 2017-04-23 DIAGNOSIS — O30042 Twin pregnancy, dichorionic/diamniotic, second trimester: Secondary | ICD-10-CM

## 2017-04-23 NOTE — Progress Notes (Signed)
ROB ER follow up/Numbness

## 2017-04-23 NOTE — Telephone Encounter (Signed)
Called patient due to lwot to inquire about condition and follow up plans. She agrees to call OB to inform of symptoms and ultrasound in epic for review.  Also notified of elevated blood pressure noted in triage.

## 2017-04-24 ENCOUNTER — Ambulatory Visit: Payer: Medicaid Other | Admitting: Urology

## 2017-04-25 NOTE — Progress Notes (Signed)
Recent ER visit for right lateral thigh numbness.  Negative doppler study in ER.  Sound most consistent with neuropathy, likely compression from gravid uterus

## 2017-04-30 ENCOUNTER — Other Ambulatory Visit: Payer: Self-pay

## 2017-04-30 ENCOUNTER — Observation Stay
Admission: EM | Admit: 2017-04-30 | Discharge: 2017-05-01 | Disposition: A | Discharge status: Other Acute Inpatient Hospital | Payer: Medicaid Other | Attending: Advanced Practice Midwife | Admitting: Advanced Practice Midwife

## 2017-04-30 DIAGNOSIS — O30042 Twin pregnancy, dichorionic/diamniotic, second trimester: Secondary | ICD-10-CM | POA: Diagnosis not present

## 2017-04-30 DIAGNOSIS — O30049 Twin pregnancy, dichorionic/diamniotic, unspecified trimester: Secondary | ICD-10-CM | POA: Diagnosis present

## 2017-04-30 DIAGNOSIS — O99212 Obesity complicating pregnancy, second trimester: Secondary | ICD-10-CM | POA: Insufficient documentation

## 2017-04-30 DIAGNOSIS — O132 Gestational [pregnancy-induced] hypertension without significant proteinuria, second trimester: Secondary | ICD-10-CM | POA: Diagnosis not present

## 2017-04-30 DIAGNOSIS — O99332 Smoking (tobacco) complicating pregnancy, second trimester: Secondary | ICD-10-CM | POA: Diagnosis not present

## 2017-04-30 DIAGNOSIS — Z936 Other artificial openings of urinary tract status: Secondary | ICD-10-CM | POA: Diagnosis not present

## 2017-04-30 DIAGNOSIS — F17210 Nicotine dependence, cigarettes, uncomplicated: Secondary | ICD-10-CM | POA: Diagnosis not present

## 2017-04-30 DIAGNOSIS — O26892 Other specified pregnancy related conditions, second trimester: Secondary | ICD-10-CM | POA: Insufficient documentation

## 2017-04-30 DIAGNOSIS — O10919 Unspecified pre-existing hypertension complicating pregnancy, unspecified trimester: Secondary | ICD-10-CM | POA: Diagnosis present

## 2017-04-30 DIAGNOSIS — O0990 Supervision of high risk pregnancy, unspecified, unspecified trimester: Secondary | ICD-10-CM

## 2017-04-30 DIAGNOSIS — Z3A24 24 weeks gestation of pregnancy: Secondary | ICD-10-CM | POA: Diagnosis not present

## 2017-04-30 DIAGNOSIS — N200 Calculus of kidney: Secondary | ICD-10-CM | POA: Diagnosis not present

## 2017-04-30 DIAGNOSIS — O26872 Cervical shortening, second trimester: Secondary | ICD-10-CM

## 2017-04-30 DIAGNOSIS — O4702 False labor before 37 completed weeks of gestation, second trimester: Secondary | ICD-10-CM

## 2017-04-30 NOTE — OB Triage Note (Signed)
Pt presents to L&D after getting off from [facility] [employer] [facility] hrs and is on her feet constantly. Pt complains of severe pelvic pain and pressure all day, even before work. Pt denies urinary symptoms and also denies vaginal bleed, leaking fluid and reports less fetal movement but reports it feel like there kicking lower. Pt also report sex in the last 24 hours and a mucus vaginal discharge over the last week. Pt denies cramping. Initial BP elevated. EFM and toco applied but will need US for ascertain fetal positioning. CNM Gledhill notified

## 2017-05-01 ENCOUNTER — Observation Stay: Payer: Medicaid Other

## 2017-05-01 DIAGNOSIS — O30042 Twin pregnancy, dichorionic/diamniotic, second trimester: Secondary | ICD-10-CM | POA: Diagnosis not present

## 2017-05-01 DIAGNOSIS — O10919 Unspecified pre-existing hypertension complicating pregnancy, unspecified trimester: Secondary | ICD-10-CM | POA: Diagnosis present

## 2017-05-01 DIAGNOSIS — O132 Gestational [pregnancy-induced] hypertension without significant proteinuria, second trimester: Secondary | ICD-10-CM | POA: Diagnosis not present

## 2017-05-01 DIAGNOSIS — O26872 Cervical shortening, second trimester: Secondary | ICD-10-CM

## 2017-05-01 DIAGNOSIS — Z3A24 24 weeks gestation of pregnancy: Secondary | ICD-10-CM | POA: Diagnosis not present

## 2017-05-01 LAB — URINALYSIS, COMPLETE (UACMP) WITH MICROSCOPIC
Bilirubin Urine: NEGATIVE
Glucose, UA: NEGATIVE mg/dL
KETONES UR: NEGATIVE mg/dL
Nitrite: NEGATIVE
PROTEIN: NEGATIVE mg/dL
Specific Gravity, Urine: 1.02 (ref 1.005–1.030)
pH: 6.5 (ref 5.0–8.0)

## 2017-05-01 LAB — COMPREHENSIVE METABOLIC PANEL
ALBUMIN: 2.9 g/dL — AB (ref 3.5–5.0)
ALT: 10 U/L — AB (ref 14–54)
AST: 25 U/L (ref 15–41)
Alkaline Phosphatase: 102 U/L (ref 38–126)
Anion gap: 12 (ref 5–15)
BUN: <5 mg/dL — ABNORMAL LOW (ref 6–20)
CHLORIDE: 103 mmol/L (ref 101–111)
CO2: 17 mmol/L — AB (ref 22–32)
CREATININE: 0.44 mg/dL (ref 0.44–1.00)
Calcium: 8.5 mg/dL — ABNORMAL LOW (ref 8.9–10.3)
GFR calc Af Amer: >60 mL/min (ref >60)
GLUCOSE: 113 mg/dL — AB (ref 65–99)
Potassium: 3.3 mmol/L — ABNORMAL LOW (ref 3.5–5.1)
SODIUM: 132 mmol/L — AB (ref 135–145)
Total Bilirubin: 0.2 mg/dL — ABNORMAL LOW (ref 0.3–1.2)
Total Protein: 6.5 g/dL (ref 6.5–8.1)

## 2017-05-01 LAB — CBC
HCT: 33.9 % — ABNORMAL LOW (ref 35.0–47.0)
HEMOGLOBIN: 11.5 g/dL — AB (ref 12.0–16.0)
MCH: 29.5 pg (ref 26.0–34.0)
MCHC: 33.8 g/dL (ref 32.0–36.0)
MCV: 87.3 fL (ref 80.0–100.0)
PLATELETS: 377 10*3/uL (ref 150–440)
RBC: 3.89 MIL/uL (ref 3.80–5.20)
RDW: 14.5 % (ref 11.5–14.5)
WBC: 16.6 10*3/uL — AB (ref 3.6–11.0)

## 2017-05-01 LAB — PROTEIN / CREATININE RATIO, URINE
CREATININE, URINE: 45 mg/dL
Protein Creatinine Ratio: 0.16 mg/mg{Cre} — ABNORMAL HIGH (ref 0.00–0.15)
Total Protein, Urine: 7 mg/dL

## 2017-05-01 MED ORDER — MAGNESIUM SULFATE BOLUS VIA INFUSION
4.0000 g | Freq: Once | INTRAVENOUS | Status: DC
  Filled 2017-05-01: qty 500

## 2017-05-01 MED ORDER — TERBUTALINE SULFATE 1 MG/ML IJ SOLN
0.2500 mg | Freq: Once | INTRAMUSCULAR | Status: AC
  Administered 2017-05-01: 0.25 mg via SUBCUTANEOUS

## 2017-05-01 MED ORDER — BUTORPHANOL TARTRATE 1 MG/ML IJ SOLN: 1.0000 mg | INTRAMUSCULAR | Status: DC | PRN

## 2017-05-01 MED ORDER — MAGNESIUM SULFATE 4 GM/100ML IV SOLN
INTRAVENOUS | Status: AC
  Administered 2017-05-01: 4 g
  Filled 2017-05-01: qty 100

## 2017-05-01 MED ORDER — MAGNESIUM SULFATE 40 G IN LACTATED RINGERS - SIMPLE
INTRAVENOUS | Status: AC
  Administered 2017-05-01: 2 g/h via INTRAVENOUS
  Filled 2017-05-01: qty 500

## 2017-05-01 MED ORDER — SODIUM CHLORIDE 0.9 % IV SOLN
2.0000 g | Freq: Four times a day (QID) | INTRAVENOUS | Status: DC
  Administered 2017-05-01: 2 g via INTRAVENOUS
  Filled 2017-05-01: qty 2000

## 2017-05-01 MED ORDER — BUTORPHANOL TARTRATE 1 MG/ML IJ SOLN
INTRAMUSCULAR | Status: AC
  Administered 2017-05-01: 1 mg via INTRAVENOUS
  Filled 2017-05-01: qty 1

## 2017-05-01 MED ORDER — BETAMETHASONE SOD PHOS & ACET 6 (3-3) MG/ML IJ SUSP
INTRAMUSCULAR | Status: AC
  Administered 2017-05-01: 12 mg
  Filled 2017-05-01: qty 1

## 2017-05-01 MED ORDER — TERBUTALINE SULFATE 1 MG/ML IJ SOLN
INTRAMUSCULAR | Status: AC
  Administered 2017-05-01: 0.25 mg via SUBCUTANEOUS
  Filled 2017-05-01: qty 1

## 2017-05-01 MED ORDER — LACTATED RINGERS IV SOLN
INTRAVENOUS | Status: DC
  Administered 2017-05-01: 01:00:00 via INTRAVENOUS

## 2017-05-01 MED ORDER — BUTORPHANOL TARTRATE 1 MG/ML IJ SOLN
1.0000 mg | Freq: Once | INTRAMUSCULAR | Status: AC | PRN
Start: 2017-05-01 — End: 2017-05-01
  Administered 2017-05-01: 1 mg via INTRAVENOUS

## 2017-05-01 MED ORDER — BETAMETHASONE SOD PHOS & ACET 6 (3-3) MG/ML IJ SUSP: 12.0000 mg | Freq: Once | INTRAMUSCULAR | Status: DC

## 2017-05-01 MED ORDER — MAGNESIUM SULFATE 40 G IN LACTATED RINGERS - SIMPLE
2.0000 g/h | INTRAVENOUS | Status: DC
  Administered 2017-05-01: 2 g/h via INTRAVENOUS
  Filled 2017-05-01: qty 500

## 2017-05-01 MED ORDER — PANTOPRAZOLE SODIUM 40 MG PO TBEC
40.0000 mg | DELAYED_RELEASE_TABLET | Freq: Every day | ORAL | Status: DC
Start: 2017-05-01 — End: 2017-05-01
  Administered 2017-05-01: 40 mg via ORAL
  Filled 2017-05-01: qty 1
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
## 2017-05-01 NOTE — Progress Notes (Signed)
Report called to Percell BeltAriana, RN [facility]D [facility].

## 2017-05-01 NOTE — Progress Notes (Signed)
Transport team here; report given to HastingsDenise, Charity fundraiserN.  [facility]  Patient transferred in stable condition.

## 2017-05-01 NOTE — Progress Notes (Signed)
Report to Angelique Blonderenise, Charity fundraiserN with [facility]

## 2017-05-02 ENCOUNTER — Encounter: Payer: Medicaid Other | Admitting: Obstetrics and Gynecology

## 2017-05-02 MED ORDER — INDOMETHACIN 25 MG PO CAPS
25.00 | ORAL_CAPSULE | ORAL | Status: DC
Start: 2017-05-02 — End: 2017-05-02

## 2017-05-02 MED ORDER — RANITIDINE HCL 150 MG PO TABS
150.00 | ORAL_TABLET | ORAL | Status: DC
Start: ? — End: 2017-05-02

## 2017-05-02 MED ORDER — ACETAMINOPHEN 325 MG PO TABS
650.00 | ORAL_TABLET | ORAL | Status: DC
Start: ? — End: 2017-05-02

## 2017-05-02 MED ORDER — NIFEDIPINE ER OSMOTIC RELEASE 30 MG PO TB24
30.00 | ORAL_TABLET | ORAL | Status: DC
Start: 2017-05-15 — End: 2017-05-02

## 2017-05-02 MED ORDER — IRON PO
1.00 | ORAL | Status: DC
Start: 2017-05-08 — End: 2017-05-02

## 2017-05-02 MED ORDER — ONDANSETRON HCL 4 MG/2ML IJ SOLN
4.00 | INTRAMUSCULAR | Status: DC
Start: ? — End: 2017-05-02

## 2017-05-02 MED ORDER — LIDOCAINE HCL 1 % IJ SOLN
0.50 | INTRAMUSCULAR | Status: DC
Start: ? — End: 2017-05-02

## 2017-05-12 DIAGNOSIS — Z98891 History of uterine scar from previous surgery: Secondary | ICD-10-CM | POA: Insufficient documentation

## 2017-05-14 ENCOUNTER — Telehealth: Payer: Self-pay

## 2017-05-14 MED ORDER — OXYCODONE HCL 5 MG PO TABS
5.00 | ORAL_TABLET | ORAL | Status: DC
Start: ? — End: 2017-05-14

## 2017-05-14 MED ORDER — LIDOCAINE HCL 2 % EX GEL
CUTANEOUS | Status: DC
Start: ? — End: 2017-05-14

## 2017-05-14 MED ORDER — DOCUSATE SODIUM 100 MG PO CAPS
100.00 | ORAL_CAPSULE | ORAL | Status: DC
Start: 2017-05-15 — End: 2017-05-14

## 2017-05-14 MED ORDER — MISOPROSTOL 200 MCG PO TABS
800.00 | ORAL_TABLET | ORAL | Status: DC
Start: ? — End: 2017-05-14

## 2017-05-14 MED ORDER — ENOXAPARIN SODIUM 40 MG/0.4ML ~~LOC~~ SOLN
40.00 | SUBCUTANEOUS | Status: DC
Start: 2017-05-15 — End: 2017-05-14

## 2017-05-14 MED ORDER — OXYCODONE HCL 5 MG PO TABS
10.00 | ORAL_TABLET | ORAL | Status: DC
Start: ? — End: 2017-05-14

## 2017-05-14 MED ORDER — ACETAMINOPHEN 325 MG PO TABS
975.00 | ORAL_TABLET | ORAL | Status: DC
Start: 2017-05-14 — End: 2017-05-14

## 2017-05-14 MED ORDER — CARBOPROST TROMETHAMINE 250 MCG/ML IM SOLN
250.00 | INTRAMUSCULAR | Status: DC
Start: ? — End: 2017-05-14

## 2017-05-14 MED ORDER — HYDROXYZINE PAMOATE 25 MG PO CAPS
25.00 | ORAL_CAPSULE | ORAL | Status: DC
Start: ? — End: 2017-05-14

## 2017-05-14 MED ORDER — IRON PO
1.00 | ORAL | Status: DC
Start: 2017-05-15 — End: 2017-05-14

## 2017-05-14 MED ORDER — LIDOCAINE 5 % EX PTCH
1.00 | MEDICATED_PATCH | CUTANEOUS | Status: DC
Start: 2017-05-15 — End: 2017-05-14

## 2017-05-14 MED ORDER — DIPHENOXYLATE-ATROPINE 2.5-0.025 MG PO TABS
2.00 | ORAL_TABLET | ORAL | Status: DC
Start: ? — End: 2017-05-14

## 2017-05-14 MED ORDER — OXYTOCIN IJ
10.00 | INTRAMUSCULAR | Status: DC
Start: ? — End: 2017-05-14

## 2017-05-14 MED ORDER — OXYTOCIN 10 UNIT/ML IJ SOLN
10.00 | INTRAMUSCULAR | Status: DC
Start: ? — End: 2017-05-14

## 2017-05-14 MED ORDER — ALUM & MAG HYDROXIDE-SIMETH 400-400-40 MG/5ML PO SUSP
15.00 | ORAL | Status: DC
Start: ? — End: 2017-05-14

## 2017-05-14 MED ORDER — SIMETHICONE 80 MG PO CHEW
80.00 | CHEWABLE_TABLET | ORAL | Status: DC
Start: ? — End: 2017-05-14

## 2017-05-14 MED ORDER — IBUPROFEN 600 MG PO TABS
600.00 | ORAL_TABLET | ORAL | Status: DC
Start: 2017-05-14 — End: 2017-05-14

## 2017-05-14 MED ORDER — LIDOCAINE HCL 1 % IJ SOLN
0.50 | INTRAMUSCULAR | Status: DC
Start: ? — End: 2017-05-14

## 2017-05-14 NOTE — Telephone Encounter (Signed)
Just FYI. Unsure who pt will schedule with but looks as if you have seen this patient more than other providers.

## 2017-05-14 NOTE — Telephone Encounter (Signed)
Toni Amendourtney w/[facility] calling to report patient came in w/severe Prepeclampsia and delivered baby A-breech and had a classic c-section at [id number] on 05/12/17. Pt is scheduled for 1 wk BP check w/[facility] & wants to schedule 6 wk ppc w/[facility].

## 2017-05-15 ENCOUNTER — Other Ambulatory Visit: Payer: Medicaid Other

## 2017-05-15 ENCOUNTER — Encounter: Payer: Medicaid Other | Admitting: Obstetrics and Gynecology

## 2017-05-31 ENCOUNTER — Telehealth: Payer: Self-pay

## 2017-05-31 ENCOUNTER — Ambulatory Visit: Payer: Medicaid Other | Admitting: Urology

## 2017-05-31 ENCOUNTER — Encounter: Payer: Self-pay | Admitting: Urology

## 2017-05-31 NOTE — Telephone Encounter (Signed)
Pt calling to ask if she can drive before her appt on the 29th.  [phone]  Adv pt she can drive as long as she is off her pain meds and can put right foot on the brake and look over her right shoulder without any belly pain.

## 2017-06-11 ENCOUNTER — Encounter: Payer: Self-pay | Admitting: Obstetrics and Gynecology

## 2017-06-11 ENCOUNTER — Ambulatory Visit (INDEPENDENT_AMBULATORY_CARE_PROVIDER_SITE_OTHER): Payer: Medicaid Other | Admitting: Obstetrics and Gynecology

## 2017-06-11 DIAGNOSIS — Z124 Encounter for screening for malignant neoplasm of cervix: Secondary | ICD-10-CM

## 2017-06-11 MED ORDER — METOCLOPRAMIDE HCL 10 MG PO TABS: 10.0000 mg | ORAL_TABLET | Freq: Three times a day (TID) | ORAL | 0 refills | Status: DC
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
## 2017-06-12 ENCOUNTER — Ambulatory Visit: Payer: Medicaid Other | Admitting: Maternal Newborn

## 2017-06-22 ENCOUNTER — Ambulatory Visit (INDEPENDENT_AMBULATORY_CARE_PROVIDER_SITE_OTHER): Payer: Medicaid Other | Admitting: Obstetrics and Gynecology

## 2017-06-22 ENCOUNTER — Encounter: Payer: Self-pay | Admitting: Obstetrics and Gynecology

## 2017-06-22 VITALS — BP 138/82 | HR 98 | Ht 64.0 in | Wt 239.0 lb

## 2017-06-22 DIAGNOSIS — F411 Generalized anxiety disorder: Secondary | ICD-10-CM

## 2017-06-22 DIAGNOSIS — Z0130 Encounter for examination of blood pressure without abnormal findings: Secondary | ICD-10-CM

## 2017-06-22 DIAGNOSIS — Z124 Encounter for screening for malignant neoplasm of cervix: Secondary | ICD-10-CM

## 2017-06-22 MED ORDER — NORETHIN-ETH ESTRAD-FE BIPHAS 1 MG-10 MCG / 10 MCG PO TABS: 1.0000 | ORAL_TABLET | Freq: Every day | ORAL | 3 refills | Status: DC

## 2017-06-22 MED ORDER — HYDROXYZINE HCL 25 MG PO TABS: 25.0000 mg | ORAL_TABLET | Freq: Four times a day (QID) | ORAL | 2 refills | Status: DC | PRN

## 2017-06-22 MED ORDER — ESCITALOPRAM OXALATE 10 MG PO TABS: 10.0000 mg | ORAL_TABLET | Freq: Every day | ORAL | 3 refills | Status: DC
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
## 2017-06-26 ENCOUNTER — Ambulatory Visit: Payer: Medicaid Other | Admitting: Urology

## 2017-06-26 ENCOUNTER — Encounter (INDEPENDENT_AMBULATORY_CARE_PROVIDER_SITE_OTHER): Payer: Self-pay

## 2017-06-26 LAB — PAP IG W/ RFLX HPV ASCU: PAP Smear Comment: 0

## 2017-06-28 ENCOUNTER — Ambulatory Visit: Payer: Medicaid Other | Admitting: Urology

## 2017-07-20 ENCOUNTER — Ambulatory Visit (INDEPENDENT_AMBULATORY_CARE_PROVIDER_SITE_OTHER): Payer: Medicaid Other | Admitting: Obstetrics and Gynecology

## 2017-07-20 ENCOUNTER — Encounter: Payer: Self-pay | Admitting: Obstetrics and Gynecology

## 2017-07-20 VITALS — BP 142/90 | HR 82 | Ht 64.0 in | Wt 243.0 lb

## 2017-07-20 DIAGNOSIS — O99345 Other mental disorders complicating the puerperium: Secondary | ICD-10-CM

## 2017-07-20 DIAGNOSIS — F53 Postpartum depression: Secondary | ICD-10-CM | POA: Diagnosis not present

## 2017-07-20 MED ORDER — TRAZODONE HCL 50 MG PO TABS: 50.0000 mg | ORAL_TABLET | Freq: Every evening | ORAL | 1 refills | Status: DC | PRN

## 2017-07-20 MED ORDER — FLUOXETINE HCL 20 MG PO CAPS: 20.0000 mg | ORAL_CAPSULE | Freq: Every day | ORAL | 3 refills | Status: DC
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
## 2017-08-07 ENCOUNTER — Ambulatory Visit (INDEPENDENT_AMBULATORY_CARE_PROVIDER_SITE_OTHER): Payer: Medicaid Other | Admitting: Obstetrics and Gynecology

## 2017-08-07 ENCOUNTER — Encounter: Payer: Self-pay | Admitting: Obstetrics and Gynecology

## 2017-08-07 VITALS — BP 138/88 | HR 89 | Wt 248.0 lb

## 2017-08-07 DIAGNOSIS — F53 Postpartum depression: Secondary | ICD-10-CM | POA: Diagnosis not present

## 2017-08-07 DIAGNOSIS — O99345 Other mental disorders complicating the puerperium: Secondary | ICD-10-CM

## 2017-08-07 MED ORDER — HYDROXYZINE HCL 50 MG PO TABS: 25.0000 mg | ORAL_TABLET | Freq: Four times a day (QID) | ORAL | 0 refills | Status: DC | PRN

## 2017-08-07 MED ORDER — TRAZODONE HCL 100 MG PO TABS: 100.0000 mg | ORAL_TABLET | Freq: Every evening | ORAL | 3 refills | Status: DC | PRN

## 2017-08-07 MED ORDER — FLUOXETINE HCL 40 MG PO CAPS: 40.0000 mg | ORAL_CAPSULE | Freq: Every day | ORAL | 2 refills | Status: DC
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
## 2017-08-23 ENCOUNTER — Ambulatory Visit (INDEPENDENT_AMBULATORY_CARE_PROVIDER_SITE_OTHER): Payer: Medicaid Other | Admitting: Obstetrics and Gynecology

## 2017-08-23 ENCOUNTER — Encounter: Payer: Self-pay | Admitting: Obstetrics and Gynecology

## 2017-08-23 VITALS — BP 134/80 | HR 96 | Ht 64.0 in | Wt 250.0 lb

## 2017-08-23 DIAGNOSIS — O99345 Other mental disorders complicating the puerperium: Secondary | ICD-10-CM | POA: Diagnosis not present

## 2017-08-23 DIAGNOSIS — G4700 Insomnia, unspecified: Secondary | ICD-10-CM

## 2017-08-23 DIAGNOSIS — F53 Postpartum depression: Secondary | ICD-10-CM | POA: Diagnosis not present

## 2017-08-23 MED ORDER — BUPROPION HCL ER (XL) 150 MG PO TB24: 150.0000 mg | ORAL_TABLET | Freq: Every day | ORAL | 0 refills | Status: DC
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
## 2017-08-31 ENCOUNTER — Encounter: Payer: Self-pay | Admitting: Obstetrics and Gynecology

## 2017-08-31 ENCOUNTER — Ambulatory Visit (INDEPENDENT_AMBULATORY_CARE_PROVIDER_SITE_OTHER): Payer: Medicaid Other | Admitting: Obstetrics and Gynecology

## 2017-08-31 VITALS — BP 138/80 | HR 91 | Wt 248.0 lb

## 2017-08-31 DIAGNOSIS — F329 Major depressive disorder, single episode, unspecified: Secondary | ICD-10-CM

## 2017-08-31 DIAGNOSIS — F419 Anxiety disorder, unspecified: Secondary | ICD-10-CM | POA: Diagnosis not present

## 2017-08-31 MED ORDER — BUSPIRONE HCL 7.5 MG PO TABS: 7.5000 mg | ORAL_TABLET | Freq: Two times a day (BID) | ORAL | 2 refills | Status: DC
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
## 2017-09-13 ENCOUNTER — Emergency Department: Payer: Medicaid Other

## 2017-09-13 ENCOUNTER — Other Ambulatory Visit: Payer: Self-pay

## 2017-09-13 ENCOUNTER — Encounter: Payer: Self-pay | Admitting: Emergency Medicine

## 2017-09-13 ENCOUNTER — Emergency Department
Admission: EM | Admit: 2017-09-13 | Discharge: 2017-09-13 | Disposition: A | Discharge status: Home / Self Care | Payer: Medicaid Other | Attending: Emergency Medicine | Admitting: Emergency Medicine

## 2017-09-13 DIAGNOSIS — F17210 Nicotine dependence, cigarettes, uncomplicated: Secondary | ICD-10-CM | POA: Insufficient documentation

## 2017-09-13 DIAGNOSIS — Z79899 Other long term (current) drug therapy: Secondary | ICD-10-CM | POA: Insufficient documentation

## 2017-09-13 DIAGNOSIS — R1032 Left lower quadrant pain: Secondary | ICD-10-CM

## 2017-09-13 DIAGNOSIS — R109 Unspecified abdominal pain: Secondary | ICD-10-CM

## 2017-09-13 DIAGNOSIS — N83209 Unspecified ovarian cyst, unspecified side: Secondary | ICD-10-CM

## 2017-09-13 DIAGNOSIS — N83202 Unspecified ovarian cyst, left side: Secondary | ICD-10-CM | POA: Diagnosis not present

## 2017-09-13 LAB — CBC WITH DIFFERENTIAL/PLATELET
Basophils Absolute: 0 10*3/uL (ref 0–0.1)
Basophils Relative: 1 %
EOS ABS: 0 10*3/uL (ref 0–0.7)
EOS PCT: 1 %
HCT: 37.2 % (ref 35.0–47.0)
HEMOGLOBIN: 12.9 g/dL (ref 12.0–16.0)
LYMPHS ABS: 0.5 10*3/uL — AB (ref 1.0–3.6)
Lymphocytes Relative: 14 %
MCH: 28.8 pg (ref 26.0–34.0)
MCHC: 34.7 g/dL (ref 32.0–36.0)
MCV: 83 fL (ref 80.0–100.0)
MONOS PCT: 17 %
Monocytes Absolute: 0.7 10*3/uL (ref 0.2–0.9)
Neutro Abs: 2.6 10*3/uL (ref 1.4–6.5)
Neutrophils Relative %: 67 %
PLATELETS: 244 10*3/uL (ref 150–440)
RBC: 4.48 MIL/uL (ref 3.80–5.20)
RDW: 16.2 % — ABNORMAL HIGH (ref 11.5–14.5)
WBC: 3.8 10*3/uL (ref 3.6–11.0)

## 2017-09-13 LAB — URINALYSIS, COMPLETE (UACMP) WITH MICROSCOPIC
BILIRUBIN URINE: NEGATIVE
Bacteria, UA: NONE SEEN
GLUCOSE, UA: NEGATIVE mg/dL
KETONES UR: NEGATIVE mg/dL
Leukocytes, UA: NEGATIVE
Nitrite: NEGATIVE
PH: 5 (ref 5.0–8.0)
PROTEIN: 30 mg/dL — AB
Specific Gravity, Urine: 1.024 (ref 1.005–1.030)

## 2017-09-13 LAB — BASIC METABOLIC PANEL
Anion gap: 9 (ref 5–15)
BUN: 10 mg/dL (ref 6–20)
CHLORIDE: 104 mmol/L (ref 98–111)
CO2: 20 mmol/L — ABNORMAL LOW (ref 22–32)
CREATININE: 0.71 mg/dL (ref 0.44–1.00)
Calcium: 8.2 mg/dL — ABNORMAL LOW (ref 8.9–10.3)
GFR calc Af Amer: >60 mL/min (ref >60)
GFR calc non Af Amer: >60 mL/min (ref >60)
Glucose, Bld: 178 mg/dL — ABNORMAL HIGH (ref 70–99)
Potassium: 3.6 mmol/L (ref 3.5–5.1)
SODIUM: 133 mmol/L — AB (ref 135–145)

## 2017-09-13 LAB — PREGNANCY, URINE: Preg Test, Ur: NEGATIVE

## 2017-09-13 MED ORDER — ONDANSETRON HCL 4 MG/2ML IJ SOLN
4.0000 mg | Freq: Once | INTRAMUSCULAR | Status: AC
  Administered 2017-09-13: 4 mg via INTRAVENOUS
  Filled 2017-09-13: qty 2

## 2017-09-13 MED ORDER — KETOROLAC TROMETHAMINE 30 MG/ML IJ SOLN
30.0000 mg | Freq: Once | INTRAMUSCULAR | Status: AC
  Administered 2017-09-13: 30 mg via INTRAVENOUS
  Filled 2017-09-13: qty 1

## 2017-09-13 MED ORDER — MORPHINE SULFATE (PF) 4 MG/ML IV SOLN
4.0000 mg | Freq: Once | INTRAVENOUS | Status: AC
  Administered 2017-09-13: 4 mg via INTRAVENOUS
  Filled 2017-09-13: qty 1

## 2017-09-13 MED ORDER — HYDROMORPHONE HCL 1 MG/ML IJ SOLN
1.0000 mg | Freq: Once | INTRAMUSCULAR | Status: AC
Start: 2017-09-13 — End: 2017-09-13
  Administered 2017-09-13: 1 mg via INTRAVENOUS
  Filled 2017-09-13: qty 1

## 2017-09-13 MED ORDER — SODIUM CHLORIDE 0.9 % IV BOLUS
1000.0000 mL | Freq: Once | INTRAVENOUS | Status: AC
  Administered 2017-09-13: 1000 mL via INTRAVENOUS

## 2017-09-13 MED ORDER — OXYCODONE-ACETAMINOPHEN 5-325 MG PO TABS: 1.0000 | ORAL_TABLET | Freq: Four times a day (QID) | ORAL | 0 refills | Status: DC | PRN

## 2017-09-13 MED ORDER — IBUPROFEN 600 MG PO TABS: 600.0000 mg | ORAL_TABLET | Freq: Four times a day (QID) | ORAL | 0 refills | Status: DC | PRN

## 2017-09-13 NOTE — ED Notes (Signed)
Pt transported to US
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
## 2017-09-13 NOTE — Discharge Instructions (Addendum)
You have a very large ovarian cyst, and you may need surgery to have it taken out.  See Dr. Bonney AidStaebler tomorrow as scheduled.  You may take the pain medication as needed.  If your pain gets significantly worse, if you are vomiting, or the pain is not controlled by the medications, or if you have any new or worsening symptoms like fever or weakness, return to the emergency department.

## 2017-09-13 NOTE — ED Triage Notes (Addendum)
Pt to ED via POV with c/o LFT flank pain radiating into back since yesterday, hx of kidney stones. NAD noted.

## 2017-09-13 NOTE — ED Notes (Signed)
Pt to ultrasound

## 2017-09-14 ENCOUNTER — Other Ambulatory Visit: Payer: Self-pay | Admitting: Obstetrics and Gynecology

## 2017-09-14 ENCOUNTER — Ambulatory Visit (INDEPENDENT_AMBULATORY_CARE_PROVIDER_SITE_OTHER): Payer: Medicaid Other | Admitting: Obstetrics and Gynecology

## 2017-09-14 ENCOUNTER — Encounter: Payer: Self-pay | Admitting: Obstetrics and Gynecology

## 2017-09-14 VITALS — BP 110/90 | HR 106 | Wt 251.0 lb

## 2017-09-14 DIAGNOSIS — R102 Pelvic and perineal pain: Secondary | ICD-10-CM

## 2017-09-14 DIAGNOSIS — N83202 Unspecified ovarian cyst, left side: Secondary | ICD-10-CM | POA: Diagnosis not present

## 2017-09-14 MED ORDER — OXYCODONE-ACETAMINOPHEN 5-325 MG PO TABS: 1.0000 | ORAL_TABLET | ORAL | 0 refills | Status: AC | PRN
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
## 2017-09-17 ENCOUNTER — Telehealth: Payer: Self-pay | Admitting: Obstetrics and Gynecology

## 2017-09-17 NOTE — Telephone Encounter (Signed)
-----   Message from Vena AustriaAndreas Staebler, MD sent at 09/14/2017  2:05 PM EDT ----- Regarding: Surgery Surgery Date:   LOS: same day surgery  Surgery Booking Request Patient Full Name: Courtney HolidaySara A Crunk MRN: [id number]  DOB: 01/24/1991  Surgeon: Vena AustriaAndreas Staebler, MD  Requested Surgery Date and Time: 09/18/17 Primary Diagnosis and Code: Ovarian cyst Secondary Diagnosis and Code: Pelvic pain Surgical Procedure: laparoscopic ovarian cystectomy L&D Notification:N/A Admission Status: same day surgery Length of Surgery: 1h Special Case Needs: none H&P: day of is fine (date) Phone Interview or Office Pre-Admit: phone interview Interpreter: No Language: English Medical Clearance: No Special Scheduling Instructions:

## 2017-09-17 NOTE — Telephone Encounter (Signed)
Patient is aware of Pre-admit Testing phone interview tomorrow, 09/18/17 @ 1-5pm, and OR on 09/20/17. Patient is aware [facility].

## 2017-09-18 ENCOUNTER — Encounter: Payer: Self-pay | Admitting: Obstetrics and Gynecology

## 2017-09-18 ENCOUNTER — Other Ambulatory Visit: Payer: Self-pay | Admitting: Obstetrics and Gynecology

## 2017-09-18 ENCOUNTER — Inpatient Hospital Stay
Admission: RE | Admit: 2017-09-18 | Discharge: 2017-09-18 | Disposition: A | Discharge status: Home / Self Care | Payer: Medicaid Other | Source: Ambulatory Visit

## 2017-09-18 NOTE — Pre-Procedure Instructions (Signed)
Called pt to do pre op phone interview and pt states her pain has gotten better and that she is not having her surgery.  I asked if she had called Dr Alvia GroveStaeblers office and she said no that she was going to call later.  I told her she really needed to let them know so they can take her off the OR schedule.  She verbalized she would.  I called Harri[facility]ancy at [facility] and informed her of this

## 2017-09-19 ENCOUNTER — Other Ambulatory Visit: Payer: Self-pay | Admitting: Obstetrics and Gynecology

## 2017-09-19 MED ORDER — BUSPIRONE HCL 7.5 MG PO TABS: 15.0000 mg | ORAL_TABLET | Freq: Two times a day (BID) | ORAL | 2 refills | Status: DC

## 2017-09-19 NOTE — Telephone Encounter (Signed)
Last appointment 8/2. See current message patient sent to you from this morning

## 2017-09-20 ENCOUNTER — Ambulatory Visit
Admission: RE | Admit: 2017-09-20 | Payer: Medicaid Other | Source: Ambulatory Visit | Admitting: Obstetrics and Gynecology

## 2017-09-20 ENCOUNTER — Encounter: Admission: RE | Payer: Self-pay | Source: Ambulatory Visit

## 2017-09-20 SURGERY — EXCISION, CYST, OVARY, LAPAROSCOPIC
Anesthesia: Choice

## 2017-11-01 ENCOUNTER — Other Ambulatory Visit: Payer: Self-pay | Admitting: Obstetrics and Gynecology

## 2017-12-02 ENCOUNTER — Other Ambulatory Visit: Payer: Self-pay | Admitting: Obstetrics and Gynecology

## 2017-12-03 NOTE — Telephone Encounter (Signed)
Last appointment was 8/2. Please advise

## 2017-12-10 ENCOUNTER — Other Ambulatory Visit: Payer: Self-pay | Admitting: Family

## 2017-12-10 DIAGNOSIS — R0782 Intercostal pain: Secondary | ICD-10-CM

## 2017-12-12 ENCOUNTER — Ambulatory Visit
Admission: RE | Admit: 2017-12-12 | Discharge: 2017-12-12 | Disposition: A | Discharge status: Home / Self Care | Payer: Medicaid Other | Source: Ambulatory Visit | Attending: Family | Admitting: Family

## 2017-12-12 DIAGNOSIS — R0782 Intercostal pain: Secondary | ICD-10-CM | POA: Insufficient documentation

## 2018-09-04 ENCOUNTER — Other Ambulatory Visit: Payer: Self-pay

## 2018-09-04 DIAGNOSIS — Z20822 Contact with and (suspected) exposure to covid-19: Secondary | ICD-10-CM

## 2018-09-08 LAB — NOVEL CORONAVIRUS, NAA: SARS-CoV-2, NAA: NOT DETECTED

## 2019-03-22 IMAGING — XA IR NEPHROSTOMY PLACEMENT RIGHT
4 series · 4 of 4 positions shown · non-contrast
Comparison: Renal ultrasound on 01/25/2017

INDICATION: Right hydronephrosis and urinary infection secondary to pelvic renal
calculus. Nephrostomy tube placement requested due to current first
trimester pregnancy and need to wait until at least the second
trimester to address the renal calculus.

EXAM:
IR NEPHROSTOMY PLACEMENT RIGHT

[Series 1: fl - angio · 1 of 1 slices shown (1 of 4)]
[im 1/1]
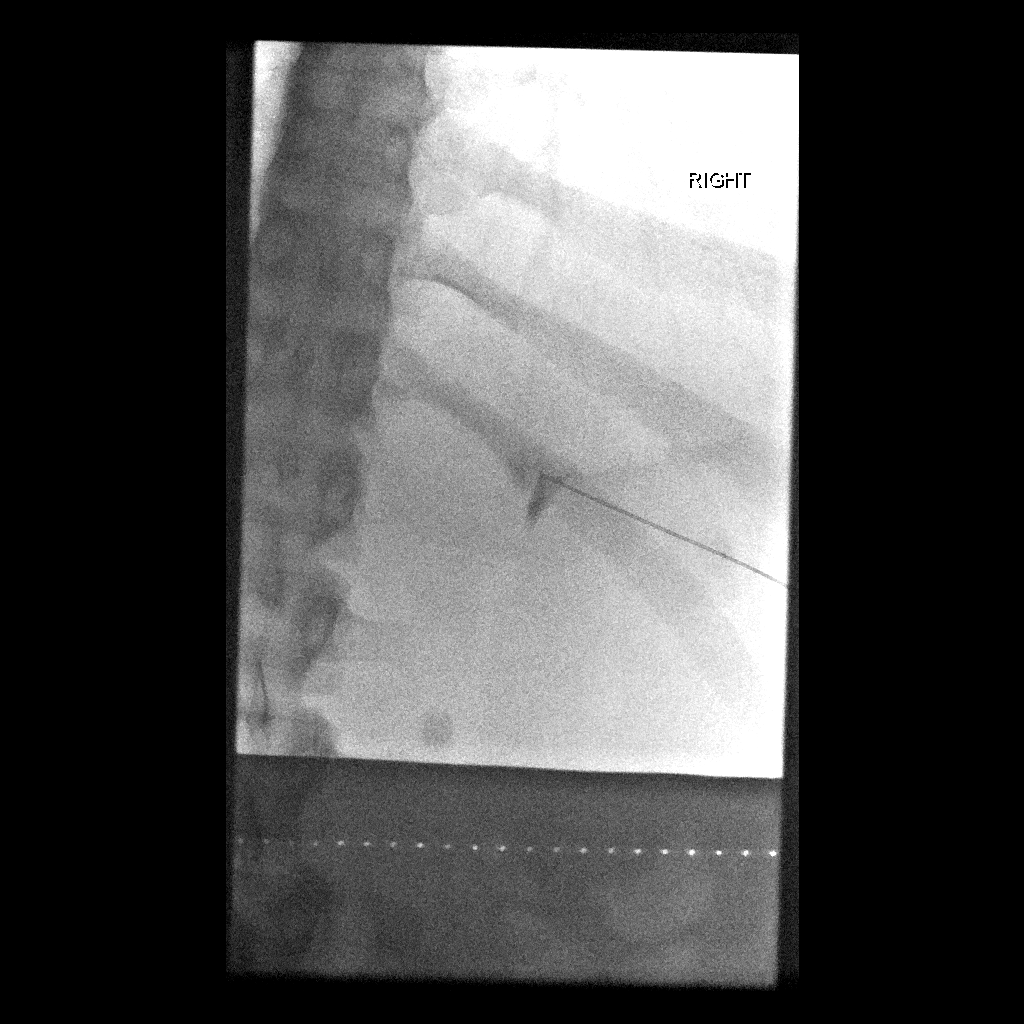

[Series 2: fl - angio · 1 of 1 slices shown (2 of 4)]
[im 1/1]
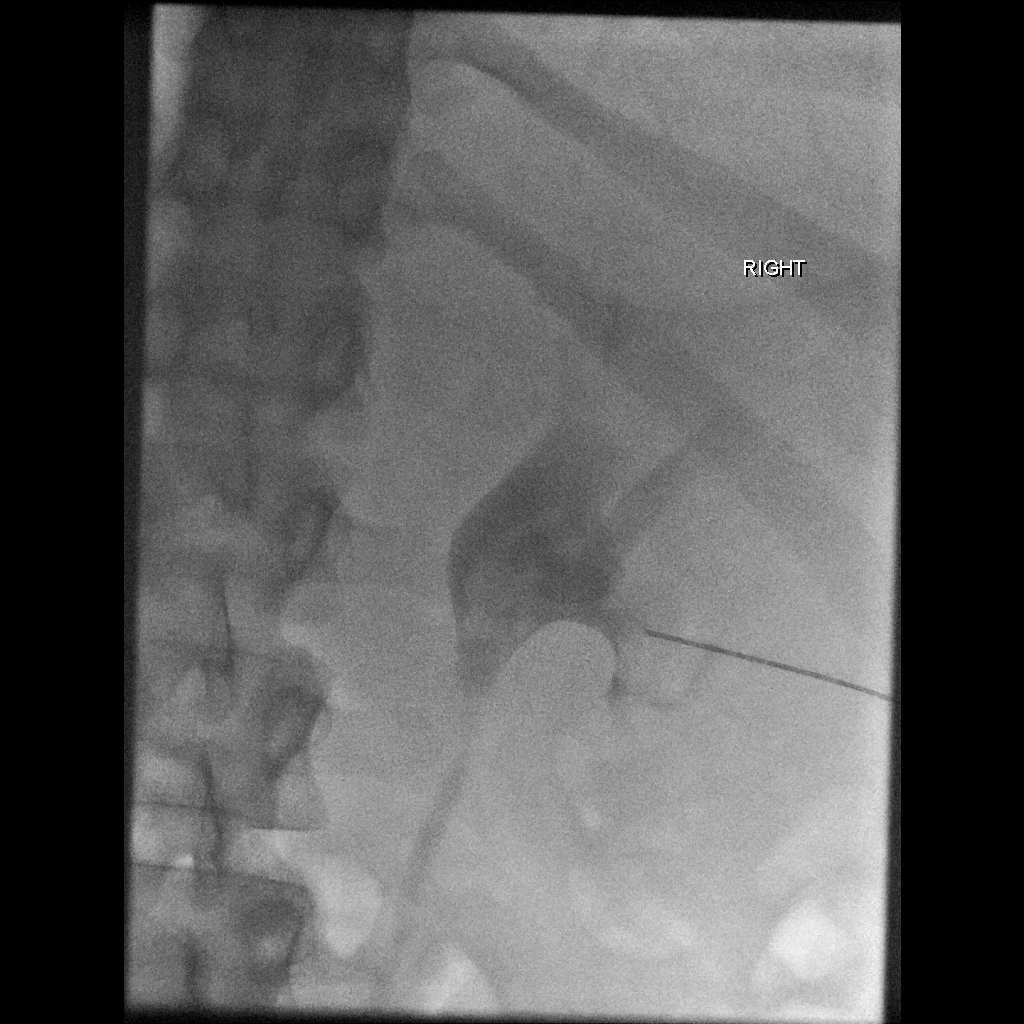

[Series 3: fl - angio · 1 of 1 slices shown (3 of 4)]
[im 1/1]
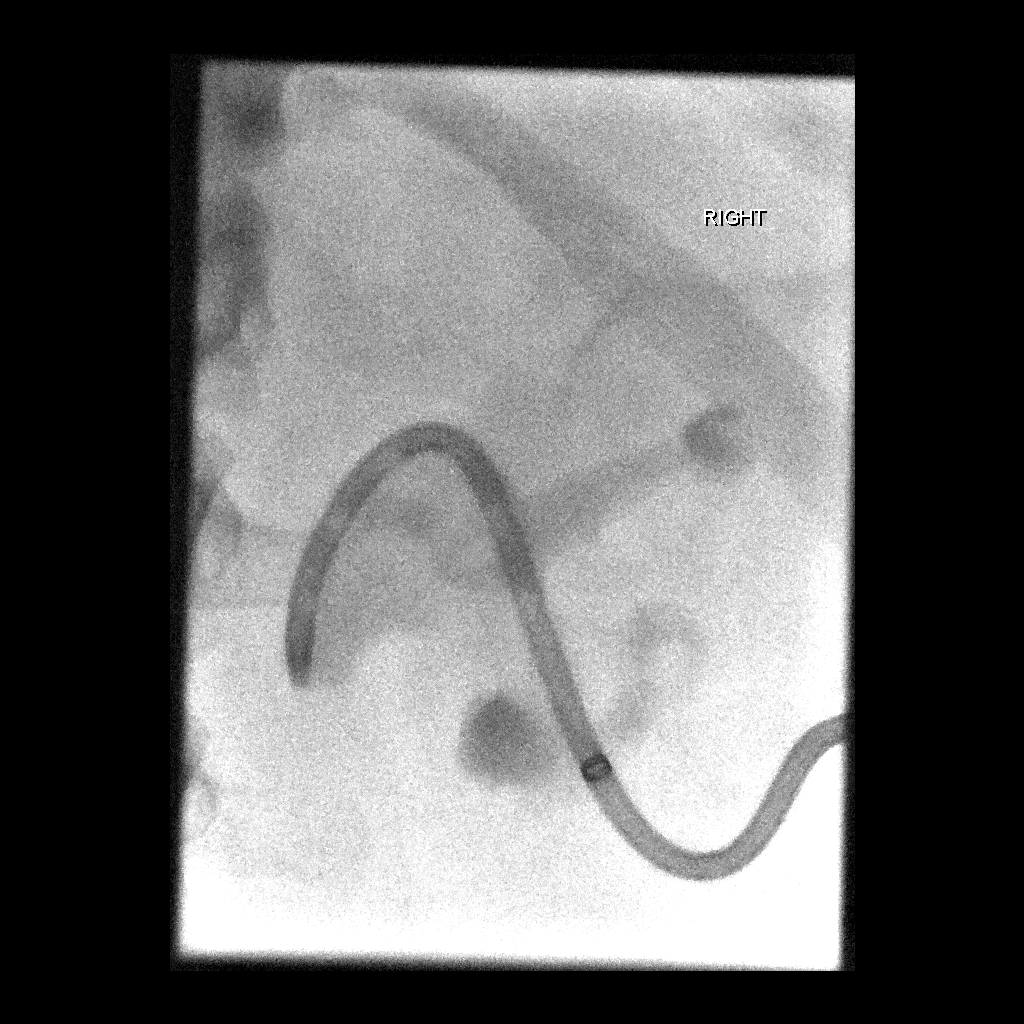

[Series 4: fl - angio · 1 of 1 slices shown (4 of 4)]
[im 1/1]
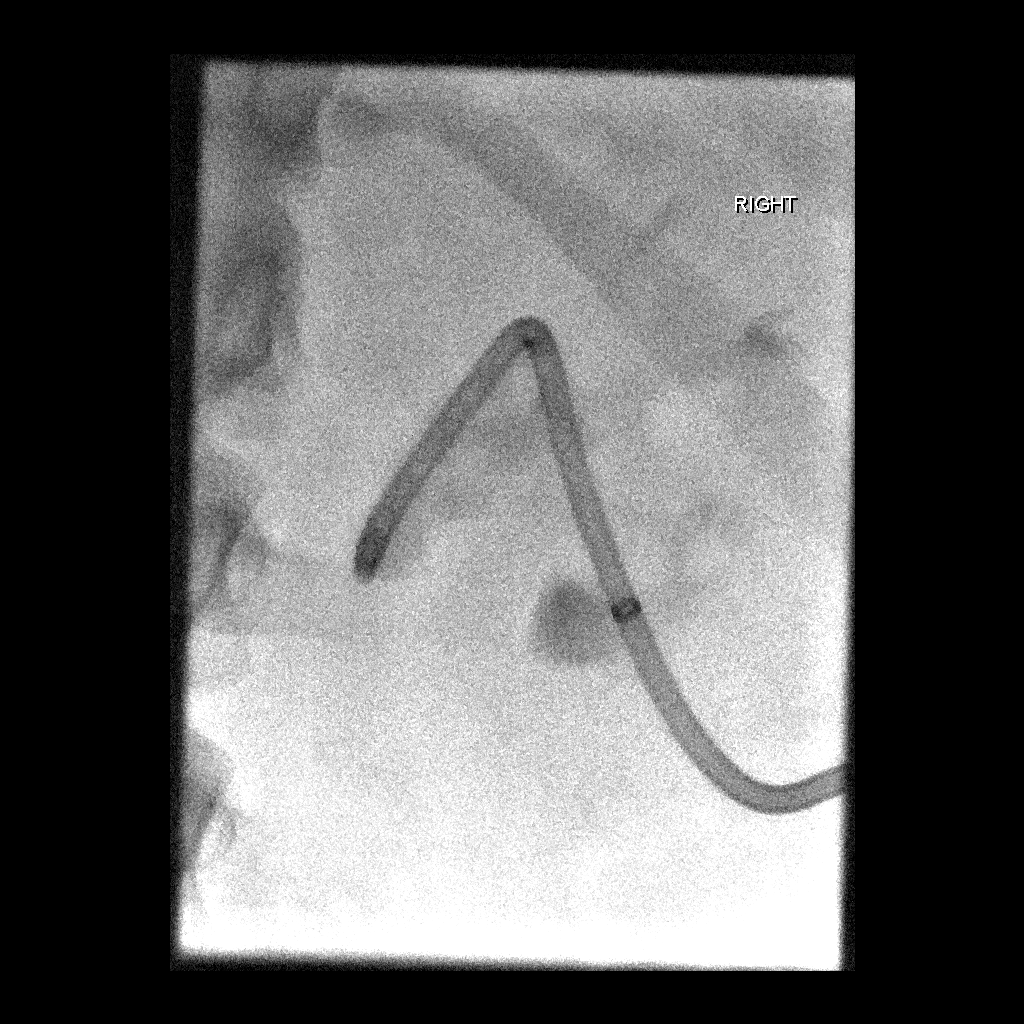

[4 of 4 positions shown; findings below may reference images not displayed]

MEDICATIONS:
Clindamycin 600 mg IV; The antibiotic was administered in an
appropriate time frame prior to skin puncture.

ANESTHESIA/SEDATION:
Fentanyl 175 mcg IV

Moderate Sedation Time:  43 minutes.

The patient was continuously monitored during the procedure by the
interventional radiology nurse under my direct supervision.

CONTRAST:  20mL ZXIZA6-C99 IOPAMIDOL (ZXIZA6-C99) INJECTION 61% -
administered into the collecting system(s)

FLUOROSCOPY TIME:  Fluoroscopy Time: 3 minutes and 6 seconds. The
patient's pelvis was wrapped fully with a lead apron during the
procedure due to current pregnancy.

COMPLICATIONS:
None immediate.

PROCEDURE:
Informed written consent was obtained from the patient after a
thorough discussion of the procedural risks, benefits and
alternatives. All questions were addressed. Maximal Sterile Barrier
Technique was utilized including caps, mask, sterile gowns, sterile
gloves, sterile drape, hand hygiene and skin antiseptic. A timeout
was performed prior to the initiation of the procedure.

Under ultrasound guidance, a 21 gauge needle was advanced into the
posterior lower pole collecting system of the right kidney. After
return of urine, diluted contrast was injected into the collecting
system. A guidewire was advanced. A transitional dilator was placed.
The percutaneous tract was dilated to 10 French and a 10 French
nephrostomy tube advanced. The catheter was secured at the skin with
a Prolene retention suture and StatLock device. It was flushed and
connected to a gravity drainage bag.
FINDINGS: Ultrasound shows mild to moderate hydronephrosis. A calculus is
visible under fluoroscopy at the [facility]. The kidney was relatively high
and access was difficult under ultrasound. Ultimately, the
nephrostomy tube was able to be advanced into the renal pelvis. It
was not fully formed as there was some trouble advancing the tube
over a guidewire. Urine return is initially bloody after tube
placement.
IMPRESSION: Placement of 10 French percutaneous nephrostomy tube in right kidney
advanced to the level of the renal pelvis. This will be left to
gravity bag drainage.

## 2019-03-22 IMAGING — US US OB EACH ADDL GEST<[ID]
2 series · 14 of 28 positions shown · non-contrast
Comparison: None.

CLINICAL DATA: Initial evaluation for acute right flank and right
lower quadrant pain.

EXAM:
TWIN OBSTETRIC <14WK  OB US

[Series 1: us ob each addl gest<(id) · 0.20mm/px · 52 acquisitions, 13 frames shown (1 of 2)]
[im 2/52]
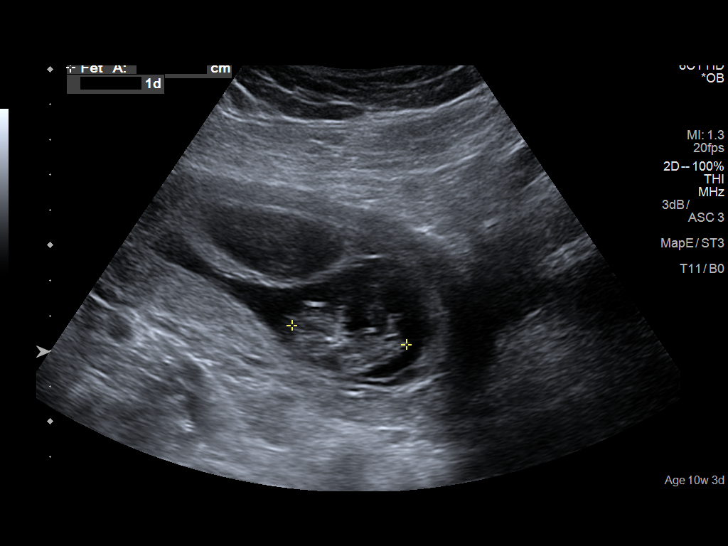
[im 6/52]
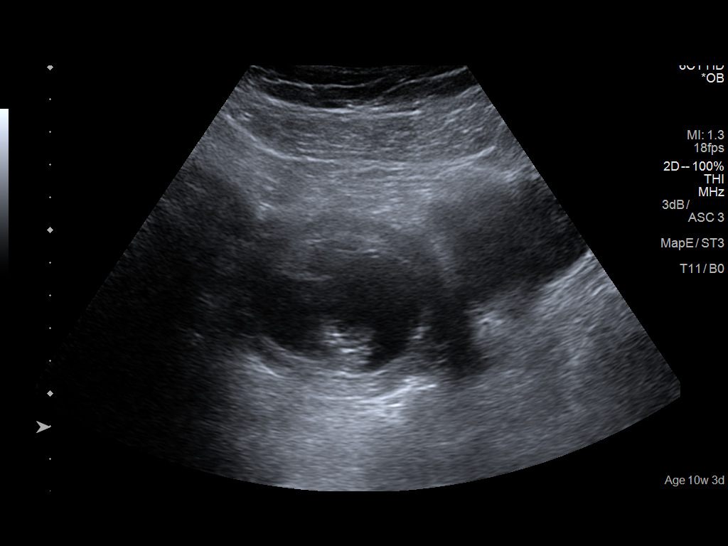
[im 10/52]
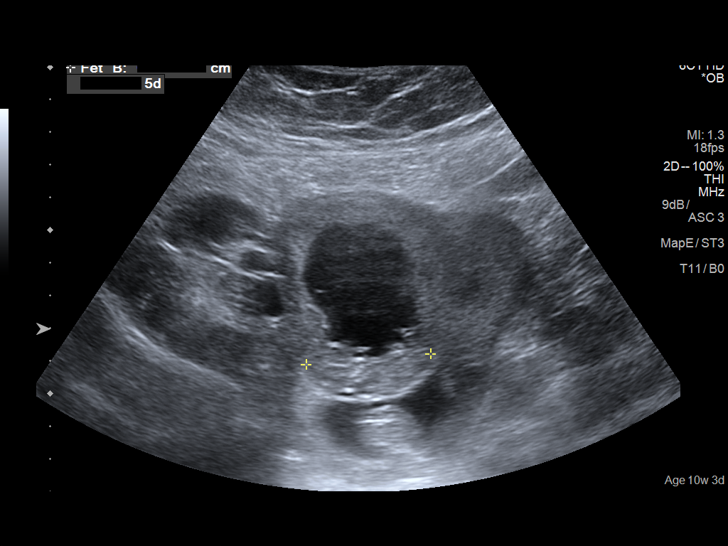
[im 14/52]
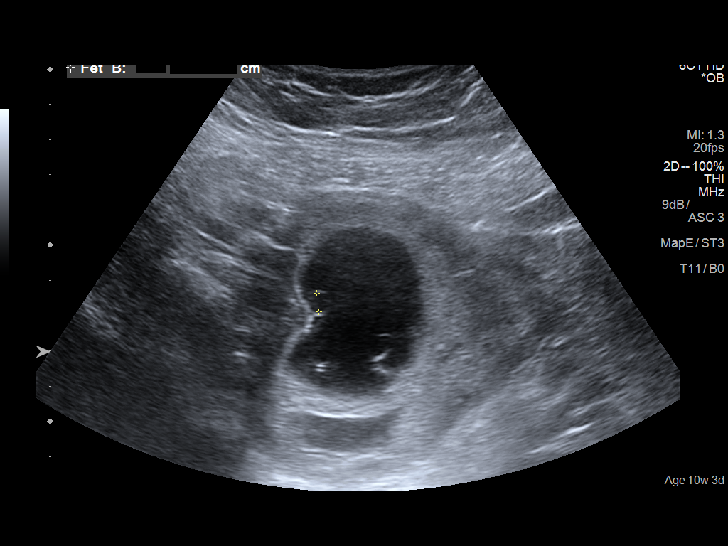
[im 18/52]
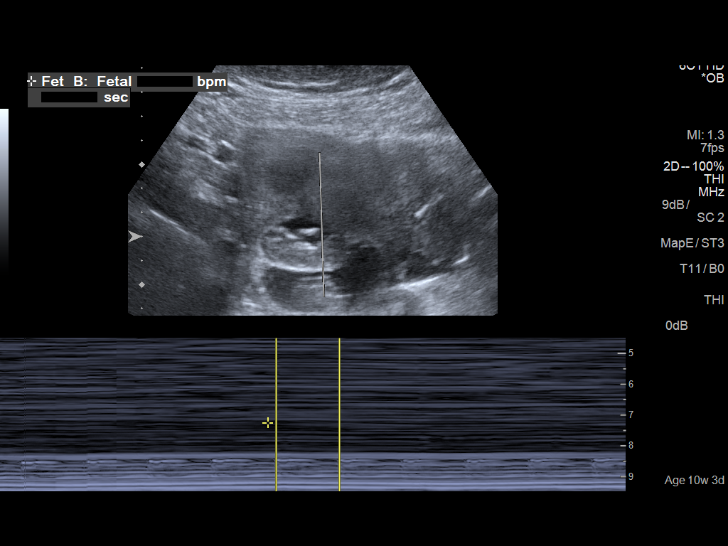
[im 22/52]
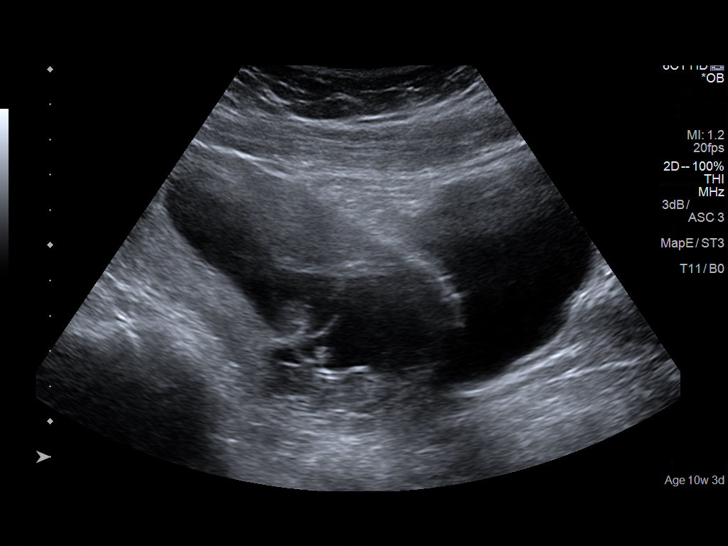
[im 26/52]
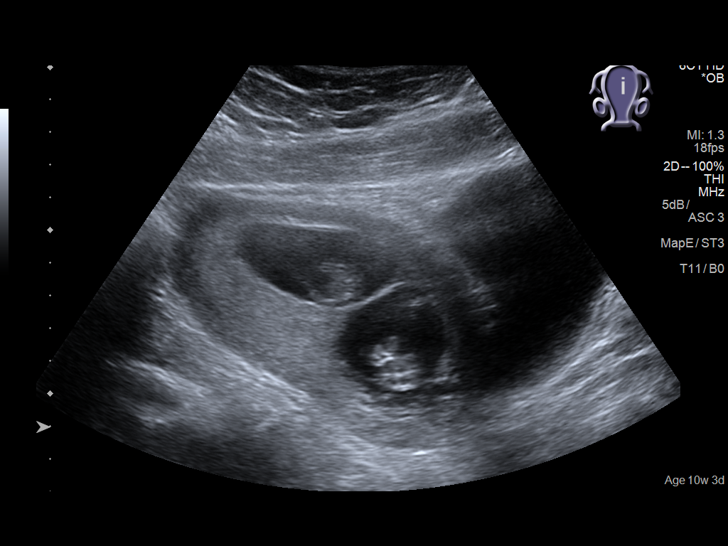
[im 30/52]
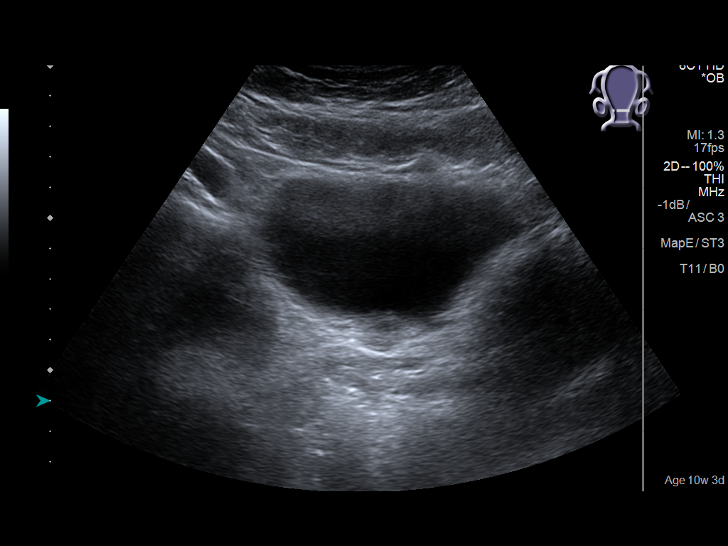
[im 34/52]
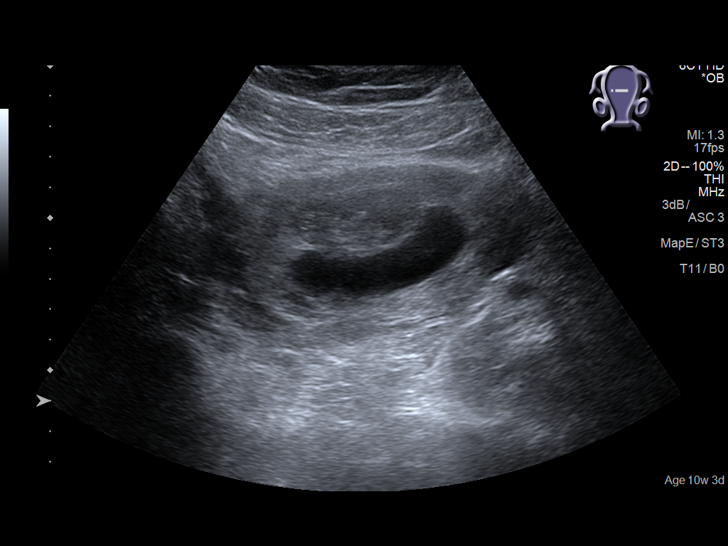
[im 38/52]
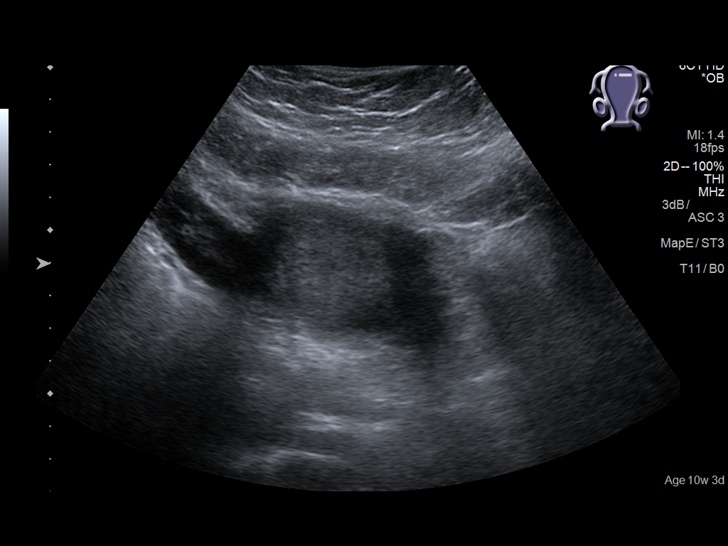
[im 42/52]
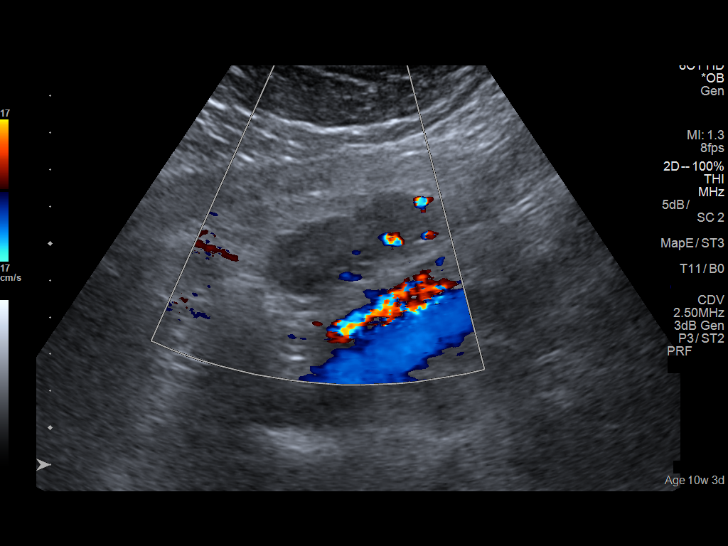
[im 46/52]
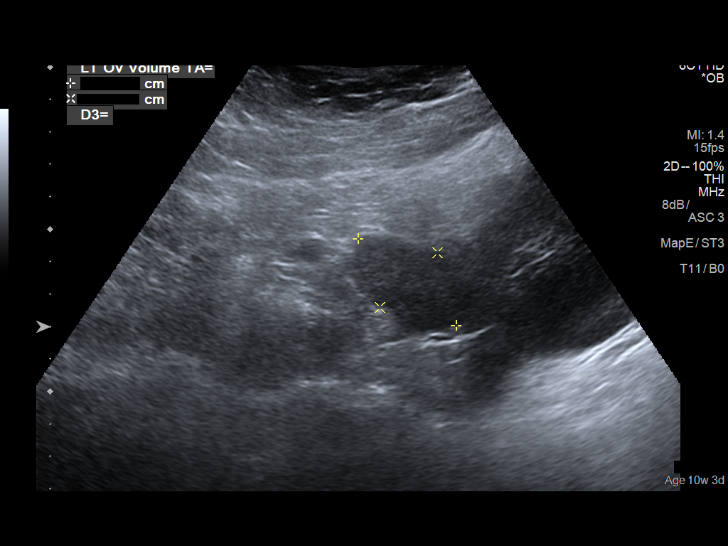
[im 50/52]
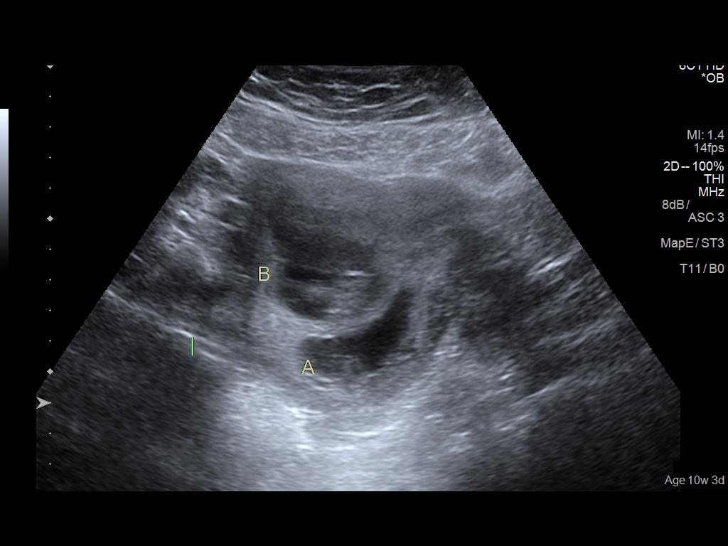

[Series 2001: us ob each addl gest<(id) · 1 of 1 slices shown (2 of 2)]
[im 1/1]
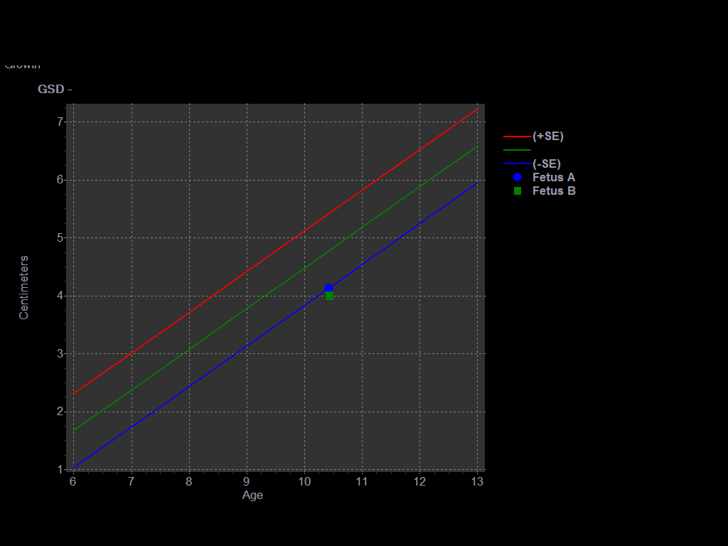

[14 of 28 positions shown; findings below may reference images not displayed]

FINDINGS: Number of IUPs:  2

Chorionicity/Amnionicity:  Dichorionic/diamniotic.

TWIN 1

Yolk sac:  Present

Embryo:  Present

Cardiac Activity: Present

Heart Rate: 168 bpm

CRL:  34.3  mm   10 w 2 d                  US EDC: 08/21/2017

TWIN 2

Yolk sac:  Present

Embryo:  Present

Cardiac Activity: Present

Heart Rate: 162 bpm

CRL:  38.8  mm   10 w 5 d                  US EDC: 08/18/2017

Subchorionic hemorrhage:  None visualized.

Maternal uterus/adnexae: Ovaries are normal in appearance
bilaterally. No adnexal mass or other abnormality. No free fluid.
IMPRESSION: 1. Twin intrauterine gestation as above, estimated gestational age
10 weeks and 2 days for twin A, and 10 weeks and 5 days for twin B.
no complication.
2. No other acute maternal uterine or adnexal abnormality
identified.

## 2019-03-27 ENCOUNTER — Other Ambulatory Visit: Payer: Self-pay | Admitting: Obstetrics and Gynecology

## 2021-02-18 ENCOUNTER — Ambulatory Visit (LOCAL_COMMUNITY_HEALTH_CENTER): Payer: Self-pay

## 2021-02-18 ENCOUNTER — Other Ambulatory Visit: Payer: Self-pay

## 2021-02-18 VITALS — BP 142/86 | Ht 64.5 in | Wt 253.5 lb

## 2021-02-18 DIAGNOSIS — Z3201 Encounter for pregnancy test, result positive: Secondary | ICD-10-CM

## 2021-02-18 LAB — PREGNANCY, URINE: Preg Test, Ur: POSITIVE — AB

## 2021-02-18 MED ORDER — PRENATAL 27-0.8 MG PO TABS: 1.0000 | ORAL_TABLET | Freq: Every day | ORAL | 0 refills | Status: AC

## 2021-02-18 NOTE — Progress Notes (Addendum)
UPT positive. Plans prenatal care at [facility]. Has appt 02/28/2021. BP elevated 142/86. Pt has hx asthma, preeclampsia and premature twin birth 2019. Consult Elveria Rising, FNP who advises RN to counsel  pt to establish prenatal care ASAP as she may be considered high risk. Advises pt to seek immediate medical attn / go to ER for increased abd pain, bleeding, trouble breathing. RN carried out provider orders. Pt in agreement. To DSS for Medicaid/preg women. Jerel Shepherd, RN

## 2021-02-28 ENCOUNTER — Encounter: Payer: Self-pay | Admitting: Licensed Practical Nurse

## 2021-03-01 NOTE — Progress Notes (Signed)
Consulted by RN re: patient situation.  Reviewed RN note and agree that it reflects our discussion and my recommendations.  ° ° °Chantee Cerino, FNP  °

## 2021-03-16 ENCOUNTER — Ambulatory Visit (INDEPENDENT_AMBULATORY_CARE_PROVIDER_SITE_OTHER): Payer: Medicaid Other | Admitting: Licensed Practical Nurse

## 2021-03-16 ENCOUNTER — Other Ambulatory Visit: Payer: Self-pay

## 2021-03-16 ENCOUNTER — Encounter: Payer: Self-pay | Admitting: Licensed Practical Nurse

## 2021-03-16 ENCOUNTER — Other Ambulatory Visit (HOSPITAL_COMMUNITY)
Admission: RE | Admit: 2021-03-16 | Discharge: 2021-03-16 | Disposition: A | Discharge status: Home / Self Care | Payer: Medicaid Other | Source: Ambulatory Visit | Attending: Licensed Practical Nurse | Admitting: Licensed Practical Nurse

## 2021-03-16 VITALS — BP 150/74 | HR 100 | Wt 249.0 lb

## 2021-03-16 DIAGNOSIS — O0991 Supervision of high risk pregnancy, unspecified, first trimester: Secondary | ICD-10-CM | POA: Diagnosis not present

## 2021-03-16 DIAGNOSIS — Z113 Encounter for screening for infections with a predominantly sexual mode of transmission: Secondary | ICD-10-CM

## 2021-03-16 DIAGNOSIS — Z124 Encounter for screening for malignant neoplasm of cervix: Secondary | ICD-10-CM | POA: Diagnosis present

## 2021-03-16 DIAGNOSIS — O99331 Smoking (tobacco) complicating pregnancy, first trimester: Secondary | ICD-10-CM

## 2021-03-16 DIAGNOSIS — O99341 Other mental disorders complicating pregnancy, first trimester: Secondary | ICD-10-CM

## 2021-03-16 DIAGNOSIS — Z6841 Body Mass Index (BMI) 40.0 and over, adult: Secondary | ICD-10-CM

## 2021-03-16 DIAGNOSIS — I10 Essential (primary) hypertension: Secondary | ICD-10-CM

## 2021-03-16 DIAGNOSIS — F17210 Nicotine dependence, cigarettes, uncomplicated: Secondary | ICD-10-CM

## 2021-03-16 DIAGNOSIS — O0990 Supervision of high risk pregnancy, unspecified, unspecified trimester: Secondary | ICD-10-CM

## 2021-03-16 DIAGNOSIS — Z131 Encounter for screening for diabetes mellitus: Secondary | ICD-10-CM

## 2021-03-16 DIAGNOSIS — Z3A10 10 weeks gestation of pregnancy: Secondary | ICD-10-CM | POA: Diagnosis not present

## 2021-03-16 DIAGNOSIS — F419 Anxiety disorder, unspecified: Secondary | ICD-10-CM

## 2021-03-16 DIAGNOSIS — O99340 Other mental disorders complicating pregnancy, unspecified trimester: Secondary | ICD-10-CM

## 2021-03-16 DIAGNOSIS — Z09 Encounter for follow-up examination after completed treatment for conditions other than malignant neoplasm: Secondary | ICD-10-CM | POA: Diagnosis not present

## 2021-03-16 LAB — POCT URINALYSIS DIPSTICK OB
Appearance: NORMAL
Bilirubin, UA: NEGATIVE
Blood, UA: NEGATIVE
Glucose, UA: NEGATIVE
Ketones, UA: NEGATIVE
Leukocytes, UA: NEGATIVE
Nitrite, UA: NEGATIVE
Odor: NORMAL
Spec Grav, UA: 1.01 (ref 1.010–1.025)
Urobilinogen, UA: 0.2 E.U./dL
pH, UA: 5 (ref 5.0–8.0)

## 2021-03-16 MED ORDER — NIFEDIPINE ER OSMOTIC RELEASE 30 MG PO TB24: 30.0000 mg | ORAL_TABLET | Freq: Every day | ORAL | 2 refills | Status: DC

## 2021-03-16 MED ORDER — BUSPIRONE HCL 7.5 MG PO TABS: 7.5000 mg | ORAL_TABLET | Freq: Two times a day (BID) | ORAL | 1 refills | Status: DC

## 2021-03-16 NOTE — Progress Notes (Signed)
New Obstetric Patient H&P    Chief Complaint: "Desires prenatal care"   History of Present Illness: Patient is a 31 y.o. [id number] Not Hispanic or Latino female, presents with amenorrhea and positive home pregnancy test. Patient's last menstrual period was 01/03/2021 (exact date). and based on her  LMP, her EDD is Estimated Date of Delivery: 10/10/21 and her EGA is [redacted]w[redacted]d. Cycles are 5. days, regular, and occur approximately every :  monthly   Her last pap smear was 4 years ago and was no abnormalities.   Was taking OCP's but was getting HA's so stopped and then became pregnant.    Her last menstrual period was normal. Since her LMP she claims she has experienced fatigue,breast tenderness,SOB, N/V, frequent urination, right flank pain, hot flashes and anxiety.. She denies vaginal bleeding. Her past medical history is contibutory. Obesity, CHTN, Anxiety, Asthma. Her prior pregnancies are notable for  preterm labor, classical c/s, , Di/DI twins, Preeclampsia PPD   Pt reports her BP are fine when she is not pregnant, was started on medications during her  last pregnancy but stopped then PP.  BP noted to be elevated x 2 today.   Pt desires medication for anxiety, Buspar has worked for her in the past.   Since her LMP, she admits to the use of tobacco products  yes 10-15 cigarettes per day  She claims she has gained   no pounds since the start of her pregnancy.  There are cats in the home in the home  no She admits close contact with children on a regular basis  yes  She has had chicken pox in the past yes She has had Tuberculosis exposures, symptoms, or previously tested positive for TB   no Current or past history of domestic violence. no  Genetic Screening/Teratology Counseling: (Includes patient, baby's father, or anyone in either family with:)  Both she and her partner are Caucasian   1. Patient's age >/= 20 at [facility]  no 2. Thalassemia ([place], [place], [place], or Asian background):  MCV<80  no 3. Neural tube defect (meningomyelocele, spina bifida, anencephaly)  no 4. Congenital heart defect  no  5. Down syndrome  no 6. Tay-Sachs (Jewish, [place])  no 7. Canavan's Disease  no 8. Sickle cell disease or trait (African)  no  9. Hemophilia or other blood disorders  no  10. Muscular dystrophy  no  11. Cystic fibrosis  no  12. Huntington's Chorea  no  13. Mental retardation/autism  no 14. Other inherited genetic or chromosomal disorder  no 15. Maternal metabolic disorder (DM, PKU, etc)  no 16. Patient or FOB with a child with a birth defect not listed above no  16a. Patient or FOB with a birth defect themselves no 17. Recurrent pregnancy loss, or stillbirth  no  18. Any medications since LMP other than prenatal vitamins (include vitamins, supplements, OTC meds, drugs, alcohol)  no 19. Any other genetic/environmental exposure to discuss  no  Infection History:   1. Lives with someone with TB or TB exposed  no  2. Patient or partner has history of genital herpes  no 3. Rash or viral illness since LMP  no 4. History of STI (GC, CT, HPV, syphilis, HIV)  no 5. 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    Review of Systems:10 point review of systems negative unless otherwise  noted in HPI  Past Medical History:  Patient Active Problem List   Diagnosis Date Noted   Short cervix during pregnancy in second trimester 05/01/2017   Indication for care in labor and delivery, antepartum 04/30/2017   Flank pain 01/27/2017   Right ureteral stone     Past Surgical History:  Past Surgical History:  Procedure Laterality Date   CESAREAN SECTION  05/12/2017   CYSTOSCOPY WITH STENT PLACEMENT Right 03/14/2017   Procedure: CYSTOSCOPY WITH STENT PLACEMENT;  Surgeon: Hollice Espy, MD;  Location: [facility] ORS;  Service: Urology;  Laterality: Right;    CYSTOSCOPY/URETEROSCOPY/HOLMIUM LASER Right 03/14/2017   Procedure: CYSTOSCOPY/URETEROSCOPY/HOLMIUM LASER;  Surgeon: Hollice Espy, MD;  Location: [facility] OR[facility];  Service: Urology;  Laterality: Right;   IR NEPHROSTOMY EXCHANGE RIGHT  03/08/2017   IR NEPHROSTOMY PLACEMENT RIGHT  01/25/2017   IR NEPHROSTOMY TUBE CHANGE  02/22/2017   OPERATIVE ULTRASOUND N/A 03/14/2017   Procedure: OPERATIVE ULTRASOUND;  Surgeon: Hollice Espy, MD;  Location: [facility] ORS;  Service: Urology;  Laterality: N/A;    Gynecologic History: Patient's last menstrual period was 01/03/2021 (exact date).  Obstetric History: G2P0102  Family History:  Family History  Problem Relation Age of Onset   Breast cancer Maternal Aunt    Breast cancer Maternal Aunt     Social History:  Social History   Socioeconomic History   Marital status: Married    Spouse name: Not on file   Number of children: Not on file   Years of education: Not on file   Highest education level: Not on file  Occupational History   Occupation: Homemaker  Tobacco Use   Smoking status: Every Day    Packs/day: 0.50    Types: Cigarettes    Start date: 01/13/2009   Smokeless tobacco: Never   Tobacco comments:    Smokes 10 cig / day/ trying to cut down since preg  Vaping Use   Vaping Use: Never used  Substance and Sexual Activity   Alcohol use: Not Currently    Comment: last ETOH - 10/2020   Drug use: Not Currently    Types: Marijuana    Comment: last mj use 2021   Sexual activity: Yes    Partners: Male    Comment: OCP stopped 09/2020- prob with migraines  Other Topics Concern   Not on file  Social History Narrative   Not on file   Social Determinants of Health   Financial Resource Strain: Not on file  Food Insecurity: Not on file  Transportation Needs: Not on file  Physical Activity: Not on file  Stress: Not on file  Social Connections: Not on file  Intimate Partner Violence: Not At Risk   Fear of Current or Ex-Partner: No    Emotionally Abused: No   Physically Abused: No   Sexually Abused: No    Allergies:  No Known Allergies  Medications: Prior to Admission medications   Medication Sig Start Date End Date Taking? Authorizing Provider  Prenatal Vit-Fe Fumarate-FA (MULTIVITAMIN-PRENATAL) 27-0.8 MG TABS tablet Take 1 tablet by mouth daily at 12 noon. 02/18/21 05/29/21 Yes Caren Macadam, MD  ibuprofen (ADVIL,MOTRIN) 600 MG tablet Take 1 tablet (600 mg total) by mouth every 6 (six) hours as needed. 09/13/17   Arta Silence, MD    Physical Exam Vitals: Blood pressure (!) 150/74, pulse 100, weight 249 lb (112.9 kg), last menstrual period 01/03/2021.  General: NAD HEENT: normocephalic, anicteric Thyroid: no enlargement, no palpable nodules Pulmonary: No increased work of breathing, CTAB Cardiovascular: RRR,  distal pulses 2+ Abdomen: NABS, soft, non-tender, non-distended.  Umbilicus without lesions.  No hepatomegaly, splenomegaly or masses palpable. No evidence of hernia  Genitourinary:  External: Normal external female genitalia.  Normal urethral meatus, normal  Bartholin's and Skene's glands.    Vagina: Normal vaginal mucosa, no evidence of prolapse.    Cervix: Grossly normal in appearance, no bleeding  Uterus: Not assessed  Adnexa: ovaries non-enlarged, no adnexal masses  Rectal: deferred Extremities: no edema, erythema, or tenderness Neurologic: Grossly intact Psychiatric: mood appropriate, affect full   Assessment: 31 y.o. [id number] at [redacted]w[redacted]d presenting to initiate prenatal care  Plan: 1) Avoid alcoholic beverages. 2) Patient encouraged not to smoke.  3) Discontinue the use of all non-medicinal drugs and chemicals.  4) Take prenatal vitamins daily.  5) Nutrition, food safety (fish, cheese advisories, and high nitrite foods) and exercise discussed. 6) Hospital and practice style discussed with cross coverage system.  7) Genetic Screening, such as with 1st Trimester Screening, cell free  fetal DNA, AFP testing, and Ultrasound, as well as with amniocentesis and [facility] as appropriate, is discussed with patient. At the conclusion of today's visit patient requested genetic testing.  Will return after dating [place] for genetic testing.  8) Patient is asked about travel to areas at risk for the Zika virus, and counseled to avoid travel and exposure to mosquitoes or sexual partners who may have themselves been exposed to the virus. Testing is discussed, and will be ordered as appropriate.  9) MFM consult made given preterm birth, obesity, CHTN.  10) Procardia 30mg  XL daily sent to Pharmacy  11) will discuss with other providers best medication for anxiety.  12) return in 3 weeks for medication check  Roberto Scales, [facility], [facility] 03/16/2021, 4:58 PM

## 2021-03-17 ENCOUNTER — Telehealth: Payer: Self-pay | Admitting: Licensed Practical Nurse

## 2021-03-17 DIAGNOSIS — F419 Anxiety disorder, unspecified: Secondary | ICD-10-CM | POA: Insufficient documentation

## 2021-03-17 DIAGNOSIS — O0990 Supervision of high risk pregnancy, unspecified, unspecified trimester: Secondary | ICD-10-CM | POA: Insufficient documentation

## 2021-03-17 LAB — CBC/D/PLT+RPR+RH+ABO+RUBIGG...
Antibody Screen: NEGATIVE
Basophils Absolute: 0.1 10*3/uL (ref 0.0–0.2)
Basos: 1 %
EOS (ABSOLUTE): 0.9 10*3/uL — ABNORMAL HIGH (ref 0.0–0.4)
Eos: 8 %
HCV Ab: 0.7 s/co ratio (ref 0.0–0.9)
HIV Screen 4th Generation wRfx: NONREACTIVE
Hematocrit: 37.9 % (ref 34.0–46.6)
Hemoglobin: 13.3 g/dL (ref 11.1–15.9)
Hepatitis B Surface Ag: NEGATIVE
Immature Grans (Abs): 0.1 10*3/uL (ref 0.0–0.1)
Immature Granulocytes: 1 %
Lymphocytes Absolute: 1.9 10*3/uL (ref 0.7–3.1)
Lymphs: 17 %
MCH: 30 pg (ref 26.6–33.0)
MCHC: 35.1 g/dL (ref 31.5–35.7)
MCV: 86 fL (ref 79–97)
Monocytes Absolute: 0.6 10*3/uL (ref 0.1–0.9)
Monocytes: 6 %
Neutrophils Absolute: 7.7 10*3/uL — ABNORMAL HIGH (ref 1.4–7.0)
Neutrophils: 67 %
Platelets: 421 10*3/uL (ref 150–450)
RBC: 4.43 x10E6/uL (ref 3.77–5.28)
RDW: 11.8 % (ref 11.7–15.4)
RPR Ser Ql: NONREACTIVE
Rh Factor: POSITIVE
Rubella Antibodies, IGG: 11.6 index (ref >0.99)
Varicella zoster IgG: 1482 index (ref >165)
WBC: 11.3 10*3/uL — ABNORMAL HIGH (ref 3.4–10.8)

## 2021-03-17 LAB — HCV INTERPRETATION

## 2021-03-17 LAB — HEPATITIS C ANTIBODY: Hep C Virus Ab: 0.7 s/co ratio (ref 0.0–0.9)

## 2021-03-17 LAB — HEMOGLOBIN A1C
Est. average glucose Bld gHb Est-mCnc: 151 mg/dL
Hgb A1c MFr Bld: 6.9 % — ABNORMAL HIGH (ref 4.8–5.6)

## 2021-03-17 MED ORDER — SERTRALINE HCL 25 MG PO TABS: ORAL_TABLET | ORAL | 0 refills | Status: DC

## 2021-03-17 NOTE — Telephone Encounter (Signed)
TC to [place] reviewed options for anxiety/depression. Buspar has worked in the past, she took it PP but did not use it in pregnancy.  There is limited data on the safety of Buspar in pregnancy, therefore caution is advised. Zoloft is a possible option, this is the most common medication used in Pregnancy due to the safety profile.  Ibeth prefers to do what is safest, she has not used Zoloft in the past, is open to trying it.  Denies hx of bipolar disease. Aware some pts are at risk of SI while on Zoloft.  Will start 25mg  Zoloft x 1 week and then increase to 50mg  daily. Will return in about 3 weeks for a medication check for both Zoloft and Procardia. She has yet to pick up the Procardia.   , CNM  , [facility]  @TODAY @  3:20 PM

## 2021-03-18 ENCOUNTER — Encounter: Payer: Self-pay | Admitting: Licensed Practical Nurse

## 2021-03-18 ENCOUNTER — Other Ambulatory Visit: Payer: Self-pay | Admitting: Licensed Practical Nurse

## 2021-03-18 DIAGNOSIS — F419 Anxiety disorder, unspecified: Secondary | ICD-10-CM

## 2021-03-18 DIAGNOSIS — O24111 Pre-existing diabetes mellitus, type 2, in pregnancy, first trimester: Secondary | ICD-10-CM

## 2021-03-18 DIAGNOSIS — R160 Hepatomegaly, not elsewhere classified: Secondary | ICD-10-CM | POA: Diagnosis not present

## 2021-03-18 DIAGNOSIS — Z3A10 10 weeks gestation of pregnancy: Secondary | ICD-10-CM | POA: Diagnosis not present

## 2021-03-18 DIAGNOSIS — R110 Nausea: Secondary | ICD-10-CM | POA: Diagnosis not present

## 2021-03-18 DIAGNOSIS — J40 Bronchitis, not specified as acute or chronic: Secondary | ICD-10-CM | POA: Diagnosis not present

## 2021-03-18 DIAGNOSIS — F32A Depression, unspecified: Secondary | ICD-10-CM

## 2021-03-18 DIAGNOSIS — O9952 Diseases of the respiratory system complicating childbirth: Secondary | ICD-10-CM | POA: Diagnosis not present

## 2021-03-18 LAB — URINE CULTURE

## 2021-03-18 MED ORDER — SERTRALINE HCL 50 MG PO TABS: 50.0000 mg | ORAL_TABLET | Freq: Every day | ORAL | 1 refills | Status: DC

## 2021-03-18 MED ORDER — ACCU-CHEK GUIDE W/DEVICE KIT: 1.0000 | PACK | Freq: Four times a day (QID) | 0 refills | Status: AC

## 2021-03-18 MED ORDER — SERTRALINE HCL 25 MG PO TABS: 25.0000 mg | ORAL_TABLET | Freq: Every day | ORAL | 0 refills | Status: DC

## 2021-03-18 MED ORDER — ACCU-CHEK SOFTCLIX LANCETS MISC: 1.0000 | Freq: Four times a day (QID) | 12 refills | Status: AC

## 2021-03-18 MED ORDER — ACCU-CHEK GUIDE VI STRP: 1.0000 | ORAL_STRIP | Freq: Four times a day (QID) | 11 refills | Status: AC

## 2021-03-18 NOTE — Progress Notes (Signed)
Received message that she could not get Zoloft, [facility], pharmacist reports that her insurance would not allow a multi day medication, I need to put in separate orders for Zoloft 25mg  and 50mg .  Order placed for Zoloft 25mg  daily x 7 days and 50mg  daily after 1 week of 25mg .  TC to [place] to discuss new prescription, verbalizes understanding.  Mera mentioned she was seen at the hospital for pain in her side, was told she has a UTI and water on her lungs that is probably pneumonia and was prescribed an antibiotic. Reviewed her urine culture from 2 days ago is negative.  Discussed Diabetes diagnosis, reviewed HA1C is an indicator of the last three months sugar levels. The fact that she ate prior to the appointment is not significant. [facility] has reached out to her, but because she was at the hospital they were unable to arrange future appointments. [facility] will call her back on Monday.   , CNM  , [facility]  03/18/21  7:23 PM

## 2021-03-19 DIAGNOSIS — R109 Unspecified abdominal pain: Secondary | ICD-10-CM | POA: Diagnosis not present

## 2021-03-21 LAB — CYTOLOGY - PAP
Chlamydia: NEGATIVE
Comment: NEGATIVE
Comment: NEGATIVE
Comment: NEGATIVE
Comment: NORMAL
Diagnosis: NEGATIVE
High risk HPV: NEGATIVE
Neisseria Gonorrhea: NEGATIVE
Trichomonas: NEGATIVE

## 2021-03-22 ENCOUNTER — Telehealth: Payer: Self-pay

## 2021-03-22 LAB — URINE DRUG PANEL 7
Amphetamines, Urine: NEGATIVE ng/mL
Barbiturate Quant, Ur: NEGATIVE ng/mL
Benzodiazepine Quant, Ur: NEGATIVE ng/mL
Cannabinoid Quant, Ur: POSITIVE — AB
Cocaine (Metab.): NEGATIVE ng/mL
Opiate Quant, Ur: NEGATIVE ng/mL
PCP Quant, Ur: NEGATIVE ng/mL

## 2021-03-30 ENCOUNTER — Other Ambulatory Visit: Payer: Self-pay | Admitting: Licensed Practical Nurse

## 2021-03-30 DIAGNOSIS — O0991 Supervision of high risk pregnancy, unspecified, first trimester: Secondary | ICD-10-CM

## 2021-03-30 DIAGNOSIS — I10 Essential (primary) hypertension: Secondary | ICD-10-CM

## 2021-04-04 ENCOUNTER — Encounter: Payer: Self-pay | Admitting: Licensed Practical Nurse

## 2021-04-05 ENCOUNTER — Encounter: Payer: Self-pay | Admitting: Licensed Practical Nurse

## 2021-04-05 ENCOUNTER — Other Ambulatory Visit: Payer: Self-pay | Admitting: Licensed Practical Nurse

## 2021-04-05 ENCOUNTER — Ambulatory Visit: Payer: Medicaid Other | Admitting: *Deleted

## 2021-04-05 ENCOUNTER — Other Ambulatory Visit: Payer: Self-pay | Admitting: *Deleted

## 2021-04-05 ENCOUNTER — Ambulatory Visit: Payer: Medicaid Other | Attending: Licensed Practical Nurse

## 2021-04-05 ENCOUNTER — Ambulatory Visit (HOSPITAL_BASED_OUTPATIENT_CLINIC_OR_DEPARTMENT_OTHER): Payer: Medicaid Other | Admitting: Obstetrics and Gynecology

## 2021-04-05 VITALS — BP 155/86 | HR 88

## 2021-04-05 DIAGNOSIS — I10 Essential (primary) hypertension: Secondary | ICD-10-CM

## 2021-04-05 DIAGNOSIS — O0990 Supervision of high risk pregnancy, unspecified, unspecified trimester: Secondary | ICD-10-CM | POA: Diagnosis present

## 2021-04-05 DIAGNOSIS — O10011 Pre-existing essential hypertension complicating pregnancy, first trimester: Secondary | ICD-10-CM | POA: Insufficient documentation

## 2021-04-05 DIAGNOSIS — O24111 Pre-existing diabetes mellitus, type 2, in pregnancy, first trimester: Secondary | ICD-10-CM

## 2021-04-05 DIAGNOSIS — Z6841 Body Mass Index (BMI) 40.0 and over, adult: Secondary | ICD-10-CM

## 2021-04-05 DIAGNOSIS — Z3A13 13 weeks gestation of pregnancy: Secondary | ICD-10-CM

## 2021-04-05 DIAGNOSIS — R7303 Prediabetes: Secondary | ICD-10-CM

## 2021-04-05 DIAGNOSIS — O34219 Maternal care for unspecified type scar from previous cesarean delivery: Secondary | ICD-10-CM

## 2021-04-05 DIAGNOSIS — O10912 Unspecified pre-existing hypertension complicating pregnancy, second trimester: Secondary | ICD-10-CM

## 2021-04-05 DIAGNOSIS — O99211 Obesity complicating pregnancy, first trimester: Secondary | ICD-10-CM | POA: Diagnosis not present

## 2021-04-05 DIAGNOSIS — O0991 Supervision of high risk pregnancy, unspecified, first trimester: Secondary | ICD-10-CM | POA: Insufficient documentation

## 2021-04-05 DIAGNOSIS — O24912 Unspecified diabetes mellitus in pregnancy, second trimester: Secondary | ICD-10-CM

## 2021-04-05 DIAGNOSIS — E119 Type 2 diabetes mellitus without complications: Secondary | ICD-10-CM

## 2021-04-05 MED ORDER — METFORMIN HCL 500 MG PO TABS: 750.0000 mg | ORAL_TABLET | Freq: Two times a day (BID) | ORAL | 5 refills | Status: DC

## 2021-04-05 MED ORDER — NIFEDIPINE ER OSMOTIC RELEASE 30 MG PO TB24: 30.0000 mg | ORAL_TABLET | Freq: Two times a day (BID) | ORAL | 2 refills | Status: DC

## 2021-04-05 MED ORDER — METFORMIN HCL 500 MG PO TABS: 750.0000 mg | ORAL_TABLET | Freq: Every day | ORAL | 0 refills | Status: DC

## 2021-04-05 NOTE — Progress Notes (Signed)
Message from MFM recommending starting treatment for diabetes, Dr Marcelline Mates reviewed chart and made recommendations. Pt called, left lengthy voice message followed by detailed mychart message.  Referral to [facility] in, Procardia changed to twice a day, will start Metformin 750mg  daily x 1 week followed by 750mg  twice a week. Reminded pt to see [facility].  Pt has ROB 2/13 Courtney Burnett, [place], [facility]  04/05/21  5:27 PM

## 2021-04-05 NOTE — Progress Notes (Signed)
Maternal-Fetal Medicine   Name: Courtney Burnett DOB: 1990-06-20 MRN: [id number] Referring Provider: Nunzio Cobbs Dominic, CNM  I had the pleasure of seeing [name] today at the [place] [facility]. She is G2 P0102at 13-weeks' gestation and is here for ultrasound and MFM consultation. Her high-risk problems include: -Chronic hypertension. Patient takes nifedipine XL 30 mg daily. Blood pressures today at our office were 161/101 and 155/86-mm Hg. Patient does not have symptoms of severe headache or visual disturbances. -Type 2 diabetes. Diagnosed 2 weeks ago based on increased hemoglobin A1C (6.9%) Patient has not met with a diabetic educator yet. She reports she is regularly checking her blood glucose.  Fasting levels have been reportedly between 116 and 118 mg/dL and postprandial levels are between 124 and 144 mg/DL.  She is not taking oral hypoglycemics or insulin. -History of preterm delivery of twins at [redacted] weeks gestation. -History of cesarean delivery (inverted T incision extending into the upper segment). -Tobacco dependence.  Smokes 10 cigarettes daily. -Class III obesity. -Depression: Patient takes sertraline 50 mg daily and reports feeling well.  Obstetric history is significant for a cesarean delivery at [facility] at [redacted] weeks gestation of twin female infants.  Both her children are in good health now.  In March 2019, at 32-weeks' gestation, she was admitted to Ob-Gyn at [facility] with diagnosis of preterm labor. The cervix was 3 cm dilated. Patient was transferred to [facility]. She was managed as an inpatient till her labor progressed. Before delivery, her pregnancy was also complicated by superimposed preeclampsia with severe features.   At 25-weeks' gestation, the patient had a cesarean delivery with low-transverse incision that was extended upward into the upper segment.  Early in that pregnancy, patient had right nephrostomy tube placed because of ureteral calculi.   Later in the second trimester, she underwent ureteroscopy and lithotripsy.  Nephrostomy tube was removed at lithotripsy surgery.  GYN history: No history of abnormal Pap smears or cervical surgeries. Past surgical history: Cesarean section, lithotripsy, nephrostomy. Medications: Prenatal vitamins, nifedipine XL 30 mg daily, sertraline 50 mg daily. Allergies: No known drug allergies. Social history: Smokes 10 cigarettes daily.  No history of alcohol use.  Patient admits to smoking marijuana in early pregnancy.  She has been married 5 years and her husband is in good health.  He is Caucasian. Family history: Father died at age 62 from myocardial infarction.  Mother died at age 31 years.  She had COPD, diabetes, and coronary stent.  No history of venous thromboembolism in the family.  Labs: Hemoglobin 12.9, hematocrit 39.3, WBC 9, platelets 385.  Hemoglobin A1c 6.9%.  Ultrasound The CRL measurement is consistent with the previously established dates and good fetal heart activity seen.  Fetal anatomical survey that could be ascertained at this gestational age appears normal.  The nuchal translucency measurement is normal.  Our concerns include Chronic hypertension -Adverse outcomes of severe chronic hypertension include maternal stroke, endorgan damage, coagulation disturbances and placental abruption.  Superimposed preeclampsia occurs in 30% of women with chronic hypertension.  I discussed the benefit of low-dose aspirin prophylaxis that helps delaying or preventing preeclampsia. I advised her to take aspirin from now till delivery. Patient does not have contraindications to delivery.  -I discussed the safety profile of antihypertensives.  Nifedipine. can be safely given in pregnancy.  It can be associated with low birthweights.  Alternative medications include labetalol.  -I discussed ultrasound protocol of monitoring fetal growth assessment and antenatal testing. -Timing of delivery:  Provided her  blood pressures are well controlled, she can be delivered at 39 weeks' gestation.  Early term delivery is an option if hypertension is not well controlled. Diabetes and its control will influence timing of delivery.  Pregestational diabetes  Based on her history and hemoglobin A1C, the patient has type 2 diabetes.  Both her fasting and postprandial levels are increased.  I explained the normal parameters of blood glucose levels and encouraged her to regularly check her blood glucose levels.  I informed the patient that she would require insulin for control of diabetes.  I discussed the importance of good control of diabetes to prevent adverse fetal and neonatal outcomes. Miscarriages are increased. The likelihood of congenital malformations with a periconception hemoglobin A1C of 6.9% is very low. However, fetal congenital malformations are slightly increased even if diabetes is well controlled.  Other complications include fetal macrosomia leading shoulder dystocia or neurological injuries at birth (Erb's palsy), which can occur even with cesarean delivery. Neonatal complications including respiratory-distress syndrome, hypoglycemia can also occur. In poorly-controlled diabetes, the likelihood of stillbirth is increased.  I discussed management that includes diet, exercise, and insulin. Insulin is the mainstay of management. Metformin can be an adjunct treatment with insulin.  I discussed ultrasound protocol. Serial fetal growth assessments and weekly antenatal testing beginning at 32 weeks till delivery will be performed.  History of preterm delivery This is the most important cause for recurrence of preterm delivery.  Twin pregnancy is associated with increased risk of preterm delivery.  Studies conducted in singleton pregnancies on the benefit of progesterone showed that weekly administration of 17-OHP has been associated with reduction preterm deliveries and subsequent pregnancies.  However,  recent studies have not reproduced the results. I discussed the possible benefit of weekly progesterone injections that can be taken from [redacted] weeks gestation till [redacted] weeks gestation.  Long-term outcomes in the infants have been favorable. We recommend transvaginal ultrasound at 17/19/21/23 weeks' gestation to evaluate cervical length.  Previous cesarean delivery Since incision was extended into the upper segment, the likelihood of uterine rupture is higher.  I recommend repeat cesarean delivery at [redacted] weeks gestation.  On today's ultrasound, the placenta is posterior and there is no evidence of previa or placenta accreta spectrum.  Repeat cesarean deliveries increase the likelihood of placenta previa and/or placenta accreta spectrum.  Cigarette smoking Smoking increases the risk of maternal complications that include placental abruption and placenta previa. Fetal complications include fetal growth restriction, prematurity, stillbirth and sudden infant death syndrome (SIDS). Risk of spontaneous abortions is increased.   Smoking cessation at any gestational age is beneficial and should routinely be addressed in her prenatal visits. Nicotine-replacement therapy (NRT) can be considered if nonpharmacological approaches have failed. Its chief benefit is that it does not contain other toxic substances present in cigarettes. Cigarettes, in addition to nicotine and Carbon monoxide, contain several other toxic substances.  I counseled the patient that marijuana should not be used in pregnancies.  Sertraline (Zoloft)  No increase in congenital malformations is expected with the use of this medication. It is an SSRI agent. A small absolute increase in primary pulmonary hypertension has been observed. However, untreated depression can be associated with adverse maternal outcomes that can override the above-mentioned risk. Patient was counseled on the small risk for primary pulmonary hypertension.   Sertraline is  excreted into breast milk and long-term effects on the infant are not known. The American Academy of Pediatrics classifies this as a drug whose effect on  the newborn is unknown but may be of concern.   Prenatal I discussed the significance and limitations of cell free fetal DNA screening.  Recommendations -An appointment was made for her to return in 5 weeks for transvaginal restoration of cervical length measurement. -Cervical length measurements at 17/19/20 1/[redacted] weeks gestation. -An appointment was made for her to return at 18 weeks for detailed fetal anatomical survey. -Fetal echocardiography at 20-22 weeks by pediatric cardiologist. -Fetal growth assessments every 4 weeks. -Weekly BPP from [redacted] weeks gestation till delivery. -Repeat cesarean delivery at [redacted] weeks gestation. -Continue nifedipine. -Aspirin 81 milligrams daily. -Patient to meet with your diabetic educator.  -Weekly 17-OH progesterone prophylaxis from 16-weeks' gestation if the patient opts for treatment.  Thank you for consultation.  If you have any questions or concerns, please contact me the [facility].  Consultation including face-to-face (more than 50%) counseling 60 minutes.

## 2021-04-07 ENCOUNTER — Encounter: Payer: Self-pay | Admitting: Advanced Practice Midwife

## 2021-04-07 ENCOUNTER — Other Ambulatory Visit: Payer: Self-pay

## 2021-04-07 ENCOUNTER — Ambulatory Visit (INDEPENDENT_AMBULATORY_CARE_PROVIDER_SITE_OTHER): Payer: Medicaid Other | Admitting: Advanced Practice Midwife

## 2021-04-07 VITALS — BP 138/80 | Wt 242.0 lb

## 2021-04-07 DIAGNOSIS — O10919 Unspecified pre-existing hypertension complicating pregnancy, unspecified trimester: Secondary | ICD-10-CM

## 2021-04-07 DIAGNOSIS — O99210 Obesity complicating pregnancy, unspecified trimester: Secondary | ICD-10-CM | POA: Insufficient documentation

## 2021-04-07 DIAGNOSIS — O0992 Supervision of high risk pregnancy, unspecified, second trimester: Secondary | ICD-10-CM

## 2021-04-07 DIAGNOSIS — Z3A13 13 weeks gestation of pregnancy: Secondary | ICD-10-CM

## 2021-04-07 DIAGNOSIS — O99212 Obesity complicating pregnancy, second trimester: Secondary | ICD-10-CM

## 2021-04-07 NOTE — Progress Notes (Signed)
Routine Prenatal Care Visit  Subjective  Courtney Burnett is a 31 y.o. G2P0102 at [redacted]w[redacted]d being seen today for ongoing prenatal care.  She is currently monitored for the following issues for this high-risk pregnancy and has Flank pain; Chronic hypertension affecting pregnancy; Supervision of high risk pregnancy, antepartum; Anxiety; Obesity affecting pregnancy; and History of cesarean section, classical on their problem list.  ----------------------------------------------------------------------------------- Patient reports some lower abdominal bilateral pain. Discussed likely round ligament and comfort measures. She has an appointment scheduled with [facility] next Tuesday.  She hasn't yet started Metformin- picking it up today. She reports a problem with insurance and transfer to [facility].  Contractions: Not present. Vag. Bleeding: None.  Movement: Present. Leaking Fluid denies.  ----------------------------------------------------------------------------------- The following portions of the patient's history were reviewed and updated as appropriate: allergies, current medications, past family history, past medical history, past social history, past surgical history and problem list. Problem list updated.  Objective  Blood pressure 138/80, weight 242 lb (109.8 kg), last menstrual period 01/03/2021. Pregravid weight 255 lb (115.7 kg) Total Weight Gain -13 lb (-5.897 kg) Urinalysis: Urine Protein    Urine Glucose    Fetal Status: Fetal Heart Rate (bpm): present   Movement: Present     Unable to hear heart tones with doppler. Movement and heart beat seen on BS u/s.  Review of Blood Sugar log: Fasting about 50/50 normal/elevated- some to 160s After meals also about 50/50 normal/elevated   General:  Alert, oriented and cooperative. Patient is in no acute distress.  Skin: Skin is warm and dry. No rash noted.   Cardiovascular: Normal heart rate noted  Respiratory: Normal respiratory effort, no problems  with respiration noted  Abdomen: Soft, gravid, appropriate for gestational age. Pain/Pressure: Present     Pelvic:  Cervical exam deferred        Extremities: Normal range of motion.     Mental Status: Normal mood and affect. Normal behavior. Normal judgment and thought content.   Assessment   31 y.o. [id number] at [redacted]w[redacted]d by  10/10/2021, by Last Menstrual Period presenting for routine prenatal visit  Plan   February 2023 Problems (from 03/16/21 to present)    Problem Noted Resolved   Obesity affecting pregnancy 04/07/2021 by Rod Can, CNM No   Supervision of high risk pregnancy, antepartum 03/17/2021 by Allen Derry, CNM No   Overview Addendum 03/17/2021  2:41 PM by Allen Derry, CNM     Nursing Staff Provider  Office Location  [facility] Dating    Language  English  Anatomy US    Flu Vaccine  declined Genetic Screen  NIPS:   TDaP vaccine    Hgb A1C or  GTT Early : 6.9 Third trimester :   Covid declined   LAB RESULTS   Rhogam  NA  Blood Type O/Positive/-- (02/01 1600)   Feeding Plan Breast  Antibody Negative (02/01 1600)  Contraception  Rubella 11.60 (02/01 1600)  Circumcision  RPR Non Reactive (02/01 1600)   Pediatrician   HBsAg Negative (02/01 1600)   Support Person  HIV Non Reactive (02/01 1600)  Prenatal Classes  Varicella immune    GBS  (For PCN allergy, check sensitivities)   BTL Consent     VBAC Consent  Pap      Hgb Electro    Pelvis Tested  CF      SMA         BMI >=40 [ ]  early 1h gtt -  [ ]  screen sleep apnea [ ]   anesthesia consult (early and late if BMI > 45) [ ]  u/s for dating [ ]   [ ]  nutritional goals [ ]  folic acid 1mg  [ ]  bASA (>12 weeks) [ ]  consider nutrition consult [ ]  consider maternal EKG 1st trimester [ ]  Growth u/s 28 [ ] , 32 [ ] , 36 weeks [ ]  [ ]  NST/AFI weekly 34+ weeks (34[] ,35[] ,36[] , 37[] , 38[] , 39[] , 40[] ) [ ]  IOL by 41 weeks (scheduled, prn [] )  Obesity, CHTN, prior classical C/S, Anxiety           Preterm labor  symptoms and general obstetric precautions including but not limited to vaginal bleeding, contractions, leaking of fluid and fetal movement were reviewed in detail with the patient. Please refer to After Visit Summary for other counseling recommendations.   Return in about 2 weeks (around 04/21/2021) for rob.  Rod Can, CNM 04/07/2021 2:34 PM

## 2021-04-08 ENCOUNTER — Other Ambulatory Visit: Payer: Self-pay | Admitting: *Deleted

## 2021-04-08 DIAGNOSIS — O10911 Unspecified pre-existing hypertension complicating pregnancy, first trimester: Secondary | ICD-10-CM

## 2021-04-08 DIAGNOSIS — O24911 Unspecified diabetes mellitus in pregnancy, first trimester: Secondary | ICD-10-CM

## 2021-04-08 DIAGNOSIS — O09899 Supervision of other high risk pregnancies, unspecified trimester: Secondary | ICD-10-CM

## 2021-04-10 ENCOUNTER — Other Ambulatory Visit: Payer: Self-pay | Admitting: Licensed Practical Nurse

## 2021-04-10 DIAGNOSIS — F419 Anxiety disorder, unspecified: Secondary | ICD-10-CM

## 2021-04-10 DIAGNOSIS — F32A Depression, unspecified: Secondary | ICD-10-CM

## 2021-04-12 ENCOUNTER — Ambulatory Visit: Payer: Medicaid Other | Admitting: *Deleted

## 2021-04-19 ENCOUNTER — Emergency Department: Payer: Medicaid Other

## 2021-04-19 ENCOUNTER — Encounter: Payer: Self-pay | Admitting: Emergency Medicine

## 2021-04-19 ENCOUNTER — Emergency Department
Admission: EM | Admit: 2021-04-19 | Discharge: 2021-04-19 | Disposition: A | Discharge status: Home / Self Care | Payer: Medicaid Other | Attending: Emergency Medicine | Admitting: Emergency Medicine

## 2021-04-19 ENCOUNTER — Ambulatory Visit (INDEPENDENT_AMBULATORY_CARE_PROVIDER_SITE_OTHER): Payer: Medicaid Other | Admitting: Licensed Practical Nurse

## 2021-04-19 ENCOUNTER — Other Ambulatory Visit: Payer: Self-pay

## 2021-04-19 VITALS — BP 156/70 | Wt 243.8 lb

## 2021-04-19 DIAGNOSIS — Z3A15 15 weeks gestation of pregnancy: Secondary | ICD-10-CM | POA: Diagnosis not present

## 2021-04-19 DIAGNOSIS — Z20822 Contact with and (suspected) exposure to covid-19: Secondary | ICD-10-CM | POA: Insufficient documentation

## 2021-04-19 DIAGNOSIS — J4520 Mild intermittent asthma, uncomplicated: Secondary | ICD-10-CM | POA: Diagnosis not present

## 2021-04-19 DIAGNOSIS — Z79899 Other long term (current) drug therapy: Secondary | ICD-10-CM | POA: Diagnosis not present

## 2021-04-19 DIAGNOSIS — O0990 Supervision of high risk pregnancy, unspecified, unspecified trimester: Secondary | ICD-10-CM

## 2021-04-19 DIAGNOSIS — I10 Essential (primary) hypertension: Secondary | ICD-10-CM

## 2021-04-19 DIAGNOSIS — R0602 Shortness of breath: Secondary | ICD-10-CM

## 2021-04-19 DIAGNOSIS — O99512 Diseases of the respiratory system complicating pregnancy, second trimester: Secondary | ICD-10-CM | POA: Insufficient documentation

## 2021-04-19 DIAGNOSIS — J45909 Unspecified asthma, uncomplicated: Secondary | ICD-10-CM | POA: Diagnosis not present

## 2021-04-19 DIAGNOSIS — O162 Unspecified maternal hypertension, second trimester: Secondary | ICD-10-CM

## 2021-04-19 DIAGNOSIS — R8271 Bacteriuria: Secondary | ICD-10-CM | POA: Insufficient documentation

## 2021-04-19 DIAGNOSIS — O132 Gestational [pregnancy-induced] hypertension without significant proteinuria, second trimester: Secondary | ICD-10-CM | POA: Diagnosis not present

## 2021-04-19 DIAGNOSIS — O2342 Unspecified infection of urinary tract in pregnancy, second trimester: Secondary | ICD-10-CM | POA: Diagnosis not present

## 2021-04-19 DIAGNOSIS — O99891 Other specified diseases and conditions complicating pregnancy: Secondary | ICD-10-CM

## 2021-04-19 DIAGNOSIS — E119 Type 2 diabetes mellitus without complications: Secondary | ICD-10-CM

## 2021-04-19 LAB — CBC WITH DIFFERENTIAL/PLATELET
Abs Immature Granulocytes: 0.05 10*3/uL (ref 0.00–0.07)
Basophils Absolute: 0 10*3/uL (ref 0.0–0.1)
Basophils Relative: 0 %
Eosinophils Absolute: 0.1 10*3/uL (ref 0.0–0.5)
Eosinophils Relative: 1 %
HCT: 36 % (ref 36.0–46.0)
Hemoglobin: 12.1 g/dL (ref 12.0–15.0)
Immature Granulocytes: 1 %
Lymphocytes Relative: 23 %
Lymphs Abs: 2.4 10*3/uL (ref 0.7–4.0)
MCH: 29.2 pg (ref 26.0–34.0)
MCHC: 33.6 g/dL (ref 30.0–36.0)
MCV: 86.7 fL (ref 80.0–100.0)
Monocytes Absolute: 0.8 10*3/uL (ref 0.1–1.0)
Monocytes Relative: 8 %
Neutro Abs: 6.9 10*3/uL (ref 1.7–7.7)
Neutrophils Relative %: 67 %
Platelets: 352 10*3/uL (ref 150–400)
RBC: 4.15 MIL/uL (ref 3.87–5.11)
RDW: 13.1 % (ref 11.5–15.5)
WBC: 10.3 10*3/uL (ref 4.0–10.5)
nRBC: 0 % (ref 0.0–0.2)

## 2021-04-19 LAB — COMPREHENSIVE METABOLIC PANEL
ALT: 15 U/L (ref 0–44)
AST: 16 U/L (ref 15–41)
Albumin: 3.3 g/dL — ABNORMAL LOW (ref 3.5–5.0)
Alkaline Phosphatase: 62 U/L (ref 38–126)
Anion gap: 9 (ref 5–15)
BUN: 8 mg/dL (ref 6–20)
CO2: 20 mmol/L — ABNORMAL LOW (ref 22–32)
Calcium: 9.2 mg/dL (ref 8.9–10.3)
Chloride: 107 mmol/L (ref 98–111)
Creatinine, Ser: 0.41 mg/dL — ABNORMAL LOW (ref 0.44–1.00)
GFR, Estimated: >60 mL/min (ref >60)
Glucose, Bld: 82 mg/dL (ref 70–99)
Potassium: 3.6 mmol/L (ref 3.5–5.1)
Sodium: 136 mmol/L (ref 135–145)
Total Bilirubin: 0.3 mg/dL (ref 0.3–1.2)
Total Protein: 6.6 g/dL (ref 6.5–8.1)

## 2021-04-19 LAB — URINALYSIS, ROUTINE W REFLEX MICROSCOPIC
Bilirubin Urine: NEGATIVE
Glucose, UA: NEGATIVE mg/dL
Ketones, ur: NEGATIVE mg/dL
Nitrite: NEGATIVE
Protein, ur: NEGATIVE mg/dL
Specific Gravity, Urine: 1.012 (ref 1.005–1.030)
pH: 6 (ref 5.0–8.0)

## 2021-04-19 LAB — RESP PANEL BY RT-PCR (FLU A&B, COVID) ARPGX2
Influenza A by PCR: NEGATIVE
Influenza B by PCR: NEGATIVE
SARS Coronavirus 2 by RT PCR: NEGATIVE

## 2021-04-19 LAB — BRAIN NATRIURETIC PEPTIDE: B Natriuretic Peptide: 16.5 pg/mL (ref 0.0–100.0)

## 2021-04-19 LAB — TROPONIN I (HIGH SENSITIVITY): Troponin I (High Sensitivity): 6 ng/L (ref <18)

## 2021-04-19 MED ORDER — METFORMIN HCL 500 MG PO TABS: 750.0000 mg | ORAL_TABLET | Freq: Two times a day (BID) | ORAL | 5 refills | Status: AC

## 2021-04-19 MED ORDER — NIFEDIPINE ER OSMOTIC RELEASE 30 MG PO TB24
30.0000 mg | ORAL_TABLET | Freq: Once | ORAL | Status: AC
  Administered 2021-04-19: 30 mg via ORAL
  Filled 2021-04-19: qty 1

## 2021-04-19 MED ORDER — CEPHALEXIN 500 MG PO CAPS: 500.0000 mg | ORAL_CAPSULE | Freq: Four times a day (QID) | ORAL | 0 refills | Status: DC

## 2021-04-19 MED ORDER — ALBUTEROL SULFATE HFA 108 (90 BASE) MCG/ACT IN AERS
1.0000 | INHALATION_SPRAY | Freq: Once | RESPIRATORY_TRACT | Status: AC
  Administered 2021-04-19: 1 via RESPIRATORY_TRACT
  Filled 2021-04-19: qty 6.7

## 2021-04-19 NOTE — ED Triage Notes (Addendum)
Pt reports that she was at her OB appointment today and they told her to come here to get an IV to get her B/P down. They were also concerned because she has had some SHOB also. Pt reports that she is always SHOB. She also reports that her B/P is always high and that she is on Procardia for this ?

## 2021-04-19 NOTE — Progress Notes (Signed)
C/o still having the pain at the bottom of her stomach. ?

## 2021-04-19 NOTE — Discharge Instructions (Signed)
We have started you on an inhaler to help with any shortness of breath but if you develop worsening shortness of breath you to return to the ER to do a CT scan to rule out pulmonary embolism.  However at this time elected to want to hold off.  We have started you on some antibiotics for concern for possible infection in your urine given you are pregnant we want to be overly aggressive with treating this.  Follow-up with your OB if you have any other concerns.  Start taking your nicardipine twice a day as instructed by OB ?

## 2021-04-19 NOTE — Progress Notes (Signed)
Routine Prenatal Care Visit ? ?Subjective  ?Courtney Burnett is a 31 y.o. G2P0102 at [redacted]w[redacted]d being seen today for ongoing prenatal care.  She is currently monitored for the following issues for this high-risk pregnancy and has Flank pain; Chronic hypertension affecting pregnancy; Supervision of high risk pregnancy, antepartum; Anxiety; Obesity affecting pregnancy; and History of cesarean section, classical on their problem list.  ?----------------------------------------------------------------------------------- ?Patient reports  continues to have lower abdominal pain daily it is a little sharp .  Reviewed this could be related to scare tissue form her previous c/s. Rec castor oil packs. ?-DM: stopped Metformin on Sunday, was instructed to increase to 733m BID, she was never given that bottle at the pharmacy so only did Metformin 750mg  daily.  Reports her fasting blood sugars are 100-120, after meals are 110-116 with one meal usually 140.  Discussed she will most likely need Insulin.  Prefers to try Metformin BID before starting insulin. Encouraged to keep written long and bring it to appointments.  Gets more than 12,000 steps per day while at work, has bene trying to increase her physical activity.  ?-HTN-BP elevated today, was instructed to increase Procardia to twice a day, she only noticed the instructions on the bottle today so has not been taking Procardia twice a day. Will start taking Procardia twice daily ?-Depression-reports she is always irritable and depressed, denies SI, does not have a hard time getting out of bed, feels the Zoloft is helping-wonders if increasing the does will help.  Will increase to Zoloft 75mg  daily.  ?-High Risk pregnancy-Discussed given her HTN and DM both not well managed currently that she is best seeing high risk OB for all of her OB care, she is agreeable to seeing MFM in [place].  ? ?Contractions: Not present.  .   . Leaking Fluid denies.   ?----------------------------------------------------------------------------------- ?The following portions of the patient's history were reviewed and updated as appropriate: allergies, current medications, past family history, past medical history, past social history, past surgical history and problem list. Problem list updated. ? ?Objective  ?Blood pressure (!) 156/70, repeat 162/90 weight 243 lb 12.8 oz (110.6 kg), last menstrual period 01/03/2021. ?Pregravid weight 255 lb (115.7 kg) Total Weight Gain -11 lb 3.2 oz (-5.08 kg) ?Urinalysis: Urine Protein    Urine Glucose   ? ?Fetal Status: Fetal Heart Rate (bpm): 140's        ? ?General:  Alert, oriented and cooperative. Patient is in no acute distress.  ?Skin: Skin is warm and dry. No rash noted.   ?Cardiovascular: Normal heart rate noted  ?Respiratory: Normal respiratory effort, no problems with respiration noted  ?Abdomen: Soft, gravid, appropriate for gestational age. Pain/Pressure: Present     ?Pelvic:  Cervical exam deferred        ?Extremities: Normal range of motion.     ?Mental Status: Normal mood and affect. Normal behavior. Normal judgment and thought content.  ? ?Assessment  ? ?31 y.o. [id number] at [redacted]w[redacted]d by  10/10/2021, by Last Menstrual Period presenting for routine prenatal visit ? ?Plan  ? ?February 2023 Problems (from 03/16/21 to present)   ? ? Problem Noted Resolved  ? Obesity affecting pregnancy 04/07/2021 by Rod Can, CNM No  ? Supervision of high risk pregnancy, antepartum 03/17/2021 by Allen Derry, CNM No  ? Overview Addendum 03/17/2021  2:41 PM by Allen Derry, CNM  ?   ?Nursing Staff Provider  ?Office Location  [facility] Dating    ?Language  English  Anatomy US    ?  Flu Vaccine  declined Genetic Screen  NIPS:   ?TDaP vaccine    Hgb A1C or  ?GTT Early : 6.9 ?Third trimester :   ?Covid declined   LAB RESULTS   ?Rhogam  NA  Blood Type O/Positive/-- (02/01 1600)   ?Feeding Plan Breast  Antibody Negative (02/01 1600)   ?Contraception  Rubella 11.60 (02/01 1600)  ?Circumcision  RPR Non Reactive (02/01 1600)   ?Pediatrician   HBsAg Negative (02/01 1600)   ?Support Person  HIV Non Reactive (02/01 1600)  ?Prenatal Classes  Varicella immune  ?  GBS  (For PCN allergy, check sensitivities)   ?BTL Consent     ?VBAC Consent  Pap    ?  Hgb Electro    ?Pelvis Tested  CF   ?   SMA   ?     ? ?BMI >=40 ?[ ]  early 1h gtt -  ?[ ]  screen sleep apnea ?[ ]  anesthesia consult (early and late if BMI > 45) ?[ ]  u/s for dating [ ]   ?[ ]  nutritional goals ?[ ]  folic acid 1mg  ?[ ]  bASA (>12 weeks) ?[ ]  consider nutrition consult ?[ ]  consider maternal EKG 1st trimester ?[ ]  Growth u/s 28 [ ] , 32 [ ] , 36 weeks [ ]  ?[ ]  NST/AFI weekly 34+ weeks (34[] ,35[] ,36[] , 37[] , 38[] , 39[] , 40[] ) ?[ ]  IOL by 41 weeks (scheduled, prn [] ) ? ?Obesity, CHTN, prior classical C/S, Anxiety  ?  ?  ? ?  ?  ? ?Preterm labor symptoms and general obstetric precautions including but not limited to vaginal bleeding, contractions, leaking of fluid and fetal movement were reviewed in detail with the patient. ?Please refer to After Visit Summary for other counseling recommendations.  ? ?Pt visibly working to breath during visit, especially while reclined on exam table-states she only has a hard time breathing when she is lying down. No audible wheezing. Given her work of breathing and elevated BP instructed to go directly to ED for an evaluation, agreeable to plan.  ? ?1)Start Procardia 30mg  XL twice a day ?2) Start Metformin 750mg  twice a day ?3) Increase Zoloft to 75mg  daily ?4) return in 1 week for BP check ?5) transfer care to High risk in [place]  ? ?Roberto Scales, CNM  ?Mosetta Pigeon, [facility]  ?04/19/21  ?4:57 PM  ? ? ?

## 2021-04-19 NOTE — ED Triage Notes (Signed)
Was at [facility] and sent to ED for BP:  160/97.  EDC:  10/10/2021.  AAOx3.  Skin warm and dyr> NAD ?

## 2021-04-19 NOTE — ED Provider Notes (Signed)
? ?[facility] ?Provider Note ? ? ? Event Date/Time  ? First MD Initiated Contact with Patient 04/19/21 1736   ?  (approximate) ? ? ?History  ? ?Hypertension ? ? ?HPI ? ?Courtney Burnett is a 31 y.o. female with history of asthma, [redacted] weeks pregnant who comes in with concern for elevated blood pressure.  Patient reports that she was at her OB appointment today and they told her to come in due to her shortness of breath and blood pressure.  Patient reports that her blood pressure is always high.  She is on Procardia but is been taking it once daily and the plan was to increase it to twice daily.  She reports that she feels little bit more short of breath than normal but she states that she occasionally has some intermittent worsening shortness of breath secondary to some asthma.  She denies any medications or inhalers that she takes at home.  She states that she is just a heavy breather and that is who she is.  She denies any leg swelling, calf tenderness or other concerns for pulmonary embolism.  No history of this.  She had 1 prior pregnancy that had preeclampsia but she has had twins. ?  ? ? ?Physical Exam  ? ?Triage Vital Signs: ?ED Triage Vitals  ?Enc Vitals Group  ?   BP 04/19/21 1724 (!) 151/98  ?   Pulse Rate 04/19/21 1724 87  ?   Resp 04/19/21 1724 20  ?   Temp 04/19/21 1724 98.3 ?F (36.8 ?C)  ?   Temp Source 04/19/21 1724 Oral  ?   SpO2 04/19/21 1724 97 %  ?   Weight --   ?   Height --   ?   Head Circumference --   ?   Peak Flow --   ?   Pain Score 04/19/21 1726 6  ?   Pain Loc --   ?   Pain Edu? --   ?   Excl. in GC? --   ? ? ?Most recent vital signs: ?Vitals:  ? 04/19/21 1724  ?BP: (!) 151/98  ?Pulse: 87  ?Resp: 20  ?Temp: 98.3 ?F (36.8 ?C)  ?SpO2: 97%  ? ? ? ?General: Awake, no distress.  ?CV:  Good peripheral perfusion.  ?Resp:  Normal effort.  Breathing slightly heavy but with clear lungs. ?Abd:  No distention.  ?Other:  Soft and nontender ? ? ?ED Results / Procedures / Treatments   ? ?Labs ?(all labs ordered are listed, but only abnormal results are displayed) ?Labs Reviewed  ?RESP PANEL BY RT-PCR (FLU A&B, COVID) [id number]  ?CBC WITH DIFFERENTIAL/PLATELET  ?COMPREHENSIVE METABOLIC PANEL  ?BRAIN NATRIURETIC PEPTIDE  ?URINALYSIS, ROUTINE W REFLEX MICROSCOPIC  ?TROPONIN I (HIGH SENSITIVITY)  ? ? ? ?EKG ? ?My interpretation of EKG: ? ?Normal sinus rate of 80, no ST elevation, no T wave inversions, incomplete right bundle branch block ? ?RADIOLOGY ?I have reviewed the xray personally and see no evidence of pneumonia. ? ?PROCEDURES: ? ?Critical Care performed: No ? ?Procedures ? ? ?MEDICATIONS ORDERED IN ED: ?Medications  ?albuterol (VENTOLIN HFA) 108 (90 Base) MCG/ACT inhaler 1 puff (1 puff Inhalation Given 04/19/21 1829)  ?NIFEdipine (PROCARDIA-XL/NIFEDICAL-XL) 24 hr tablet 30 mg (30 mg Oral Given 04/19/21 1828)  ? ? ? ?IMPRESSION / MDM / ASSESSMENT AND PLAN / ED COURSE  ?I reviewed the triage vital signs and the nursing notes. ?             ?               ? ?  Patient who is currently [redacted] weeks pregnant who comes in with hypertension and shortness of breath.  Patient is only 15 weeks and does not meet criteria for preeclampsia.  I reviewed her prior blood pressures and on 1/6 she was 142/86 on 2/1 she was 150/74 on 2/21 she was 155/86 so she seems to be running where she currently runs and she already has an outpatient plan to increase her Procardia.  I be hesitant to give IV blood pressure medicine and to make her go too low.  She needs close follow-up and lowering with oral medications would be safer.  Patient does look little short of breath on examination.  We can give her another dose of her Procardia XL given the plan was to take 2 today anyways and she only took 1.  Labs will be ordered to evaluate for any evidence of renal issues, ACS, BNP elevation.  X-ray evaluate for any pneumonia, fluid. Pt consented for risk of radiation.  We discussed the possibility of pulmonary embolism but at this time  she would like to hold off on CT imaging and we can discuss further after work-up.  We can also try an inhaler to help with her known asthma although I do not hear any obvious wheezing at this time. ? ?I reviewed her records back in February 2023 where she had CT abd scan done that showed small pleural effusion and patient was treated with antibiotics for possible pneumonia. ? ?7:19 PM reevaluated patient and she is feeling much better after the inhaler.  Again we discussed pros and cons of CT and how it be the only way to evaluate for pulmonary embolism and that this can be life-threatening for both her and the baby but patient states that her breathing feels much better after the inhaler and that this is how she always breathes. Pt declining CT.  ? ?UA with significant amount of bacteria in it without any squamous cells concerning for possible asymptomatic bacteremia given she is pregnant we will start her on some Keflex.  Patient expressed understanding felt comfortable with this plan.  Urine culture is in process.  Patient still reports feeling much better blood pressure is better after increasing her Procardia and she understands continuing to take it twice a day and return if she develops worsening shortness of breath then we need to proceed with CT imaging but again she declined CT at this time. ? ?I discussed the provisional nature of ED diagnosis, the treatment so far, the ongoing plan of care, follow up appointments and return precautions with the patient and any family or support people present. They expressed understanding and agreed with the plan, discharged home. ? ? ? ? ? ? ?FINAL CLINICAL IMPRESSION(S) / ED DIAGNOSES  ? ?Final diagnoses:  ?Hypertension, unspecified type  ?Asymptomatic bacteriuria during pregnancy  ?Shortness of breath  ?Mild asthma without complication, unspecified whether persistent  ? ? ? ?Rx / DC Orders  ? ?ED Discharge Orders   ? ?      Ordered  ?  cephALEXin (KEFLEX) 500 MG  capsule  4 times daily       ? 04/19/21 2032  ? ?  ?  ? ?  ? ? ? ?Note:  This document was prepared using Dragon voice recognition software and may include unintentional dictation errors. ?  ?Concha Se, MD ?04/19/21 2056 ? ?

## 2021-04-20 ENCOUNTER — Telehealth: Payer: Self-pay

## 2021-04-20 NOTE — Telephone Encounter (Signed)
Transition Care Management Unsuccessful Follow-up Telephone Call ? ?Date of discharge and from where:  04/19/2021-[facility] ? ?Attempts:  1st Attempt ? ?Reason for unsuccessful TCM follow-up call:  Left voice message ? ?  ?

## 2021-04-21 LAB — URINE CULTURE: Culture: NO GROWTH

## 2021-04-21 NOTE — Telephone Encounter (Signed)
Transition Care Management Unsuccessful Follow-up Telephone Call ? ?Date of discharge and from where:  04/19/2021-[facility] ? ?Attempts:  2nd Attempt ? ?Reason for unsuccessful TCM follow-up call:  Left voice message ? ?  ?

## 2021-04-22 NOTE — Telephone Encounter (Signed)
Transition Care Management Unsuccessful Follow-up Telephone Call ? ?Date of discharge and from where:  04/19/2021 from [facility] ? ?Attempts:  3rd Attempt ? ?Reason for unsuccessful TCM follow-up call:  Unable to reach patient ? ? ? ?

## 2021-04-25 ENCOUNTER — Encounter: Payer: Medicaid Other | Attending: Licensed Practical Nurse | Admitting: *Deleted

## 2021-04-25 ENCOUNTER — Encounter: Payer: Self-pay | Admitting: *Deleted

## 2021-04-25 ENCOUNTER — Other Ambulatory Visit: Payer: Self-pay

## 2021-04-25 VITALS — BP 134/86 | Ht 64.0 in | Wt 241.4 lb

## 2021-04-25 DIAGNOSIS — Z713 Dietary counseling and surveillance: Secondary | ICD-10-CM | POA: Insufficient documentation

## 2021-04-25 DIAGNOSIS — Z3A00 Weeks of gestation of pregnancy not specified: Secondary | ICD-10-CM | POA: Diagnosis not present

## 2021-04-25 DIAGNOSIS — O24111 Pre-existing diabetes mellitus, type 2, in pregnancy, first trimester: Secondary | ICD-10-CM | POA: Diagnosis present

## 2021-04-25 DIAGNOSIS — E119 Type 2 diabetes mellitus without complications: Secondary | ICD-10-CM | POA: Diagnosis not present

## 2021-04-25 DIAGNOSIS — O24119 Pre-existing diabetes mellitus, type 2, in pregnancy, unspecified trimester: Secondary | ICD-10-CM

## 2021-04-25 NOTE — Patient Instructions (Signed)
Read booklet on Gestational Diabetes ?Follow Gestational Meal Planning Guidelines ?Avoid cold cereal for breakfast ?Avoid fruit at breakfast if blood sugars elevated ?Complete a 3 Day Food Record and bring to next appointment ?Check blood sugars 4 x day - before breakfast and 2 hrs after every meal and record  ?Bring blood sugar log to all appointments ?Purchase urine ketone strips if instructed by MD and check urine ketones every am:  If + increase bedtime snack to 1 protein and 2 carbohydrate servings ?Walk 20-30 minutes at least 5 x week if permitted by MD ? ?

## 2021-04-25 NOTE — Progress Notes (Signed)
Diabetes Self-Management Education ? ?Visit Type: First/Initial ? ?Appt. Start Time: 1320 Appt. End Time: 1425 ? ?04/25/2021 ? ?Ms. Courtney Burnett, identified by name and date of birth, is a 31 y.o. female with a diagnosis of Diabetes: Type 2 (Pregnant).  ? ?ASSESSMENT ? ?Blood pressure 134/86, height 5\' 4"  (1.626 m), weight 241 lb 6.4 oz (109.5 kg), last menstrual period 01/03/2021, estimated date of delivery 10/10/2021 ?Body mass index is 41.44 kg/m?. ? ? Diabetes Self-Management Education - 04/25/21 1453   ? ?  ? Visit Information  ? Visit Type First/Initial   ?  ? Initial Visit  ? Diabetes Type Type 2   Pregnant  ? Are you currently following a meal plan? No   ? Are you taking your medications as prescribed? Yes   ? Date Diagnosed Pt reports she was just told about diabetes diagnosed with this pregnancy 03/2021. Her labs show 181 mg/dL 04/2021   ?  ? Health Coping  ? How would you rate your overall health? Fair   ?  ? Psychosocial Assessment  ? Patient Belief/Attitude about Diabetes Other (comment)   "terrible"  ? Self-care barriers None   ? Self-management support Doctor's office;Family   ? Patient Concerns Nutrition/Meal planning;Glycemic Control;Medication;Monitoring;Healthy Lifestyle   ? Special Needs None   ? Preferred Learning Style Auditory;Hands on   ? Learning Readiness Change in progress   ? How often do you need to have someone help you when you read instructions, pamphlets, or other written materials from your doctor or pharmacy? 1 - Never   ? What is the last grade level you completed in school? high school   ?  ? Pre-Education Assessment  ? Patient understands the diabetes disease and treatment process. Needs Instruction   ? Patient understands incorporating nutritional management into lifestyle. Needs Instruction   ? Patient undertands incorporating physical activity into lifestyle. Needs Instruction   ? Patient understands using medications safely. Needs Instruction   ? Patient understands monitoring  blood glucose, interpreting and using results Needs Review   ? Patient understands prevention, detection, and treatment of acute complications. Needs Instruction   ? Patient understands prevention, detection, and treatment of chronic complications. Needs Instruction   ? Patient understands how to develop strategies to address psychosocial issues. Needs Instruction   ? Patient understands how to develop strategies to promote health/change behavior. Needs Instruction   ?  ? Complications  ? Last HgB A1C per patient/outside source 6.9 %   03/16/2021  ? How often do you check your blood sugar? 3-4 times/day   ? Fasting Blood glucose range (mg/dL) 05/14/2021   FBG's 77-824 mg/dL 5/5 elevated  ? Postprandial Blood glucose range (mg/dL) 235-361   pp breakfast 102-185 mg/dL 3/5 elevated; pp lunch 89-163 mg/dL 1/6 elevated; pp supper 100-141 mg/dL 4/6 elevated  ? Number of hypoglycemic episodes per month --   Pt reports symptoms but reports when she checked her blood sugars they were not less than 70 mg/dL.  ? Have you had a dilated eye exam in the past 12 months? No   ? Have you had a dental exam in the past 12 months? No   ? Are you checking your feet? No   ?  ? Dietary Intake  ? Breakfast cereal and milk; avocado toast (wheat bread) and egg, bacon or sausage, fruit (banana, apple, orange)   ? Snack (morning) reports no snacks   ? Lunch tuna sandwich on wheat bread; chicken and pasta   ? Dinner  chicken, beef, corn, beans, pasta, broccoli, asparagus, zucchini, squash, sweet potatoes, salads with lettuce, carrots, cheese, SF dressing or ranch   ? Beverage(s) water, 1 coffee per day, 1 diet soda 1-2 x week   ?  ? Exercise  ? Exercise Type Light (walking / raking leaves)   ? How many days per week to you exercise? 4   ? How many minutes per day do you exercise? 60   ? Total minutes per week of exercise 240   ?  ? Patient Education  ? Previous Diabetes Education No   ? Disease state  Definition of diabetes, type 1  and 2, and the diagnosis of diabetes;Factors that contribute to the development of diabetes   ? Nutrition management  Role of diet in the treatment of diabetes and the relationship between the three main macronutrients and blood glucose level;Food label reading, portion sizes and measuring food.;Reviewed blood glucose goals for pre and post meals and how to evaluate the patients' food intake on their blood glucose level.;Meal timing in regards to the patients' current diabetes medication.   ? Physical activity and exercise  Role of exercise on diabetes management, blood pressure control and cardiac health.   ? Medications Reviewed patients medication for diabetes, action, purpose, timing of dose and side effects.   ? Monitoring Purpose and frequency of SMBG.;Taught/discussed recording of test results and interpretation of SMBG.;Identified appropriate SMBG and/or A1C goals.;Ketone testing, when, how.   ? Chronic complications Relationship between chronic complications and blood glucose control   ? Psychosocial adjustment Identified and addressed patients feelings and concerns about diabetes   ? Preconception care Pregnancy and GDM  Role of pre-pregnancy blood glucose control on the development of the fetus;Reviewed with patient blood glucose goals with pregnancy;Role of family planning for patients with diabetes   ?  ? Individualized Goals (developed by patient)  ? Reducing Risk Other (comment)   improve blood sugars, decrease medications, prevent diabetes complications, lead a healthier lifestyle, become more fit  ?  ? Outcomes  ? Expected Outcomes Demonstrated interest in learning. Expect positive outcomes   ? Future DMSE --   1 week  ? ?  ?  ?Individualized Plan for Diabetes Self-Management Training:  ? ?Learning Objective:  Patient will have a greater understanding of diabetes self-management. ?Patient education plan is to attend individual and/or group sessions per assessed needs and concerns. ?  ?Plan:  ?Read  booklet on Gestational Diabetes ?Follow Gestational Meal Planning Guidelines ?Avoid cold cereal for breakfast ?Avoid fruit at breakfast if blood sugars elevated ?Complete a 3 Day Food Record and bring to next appointment ?Check blood sugars 4 x day - before breakfast and 2 hrs after every meal and record  ?Bring blood sugar log to all appointments ?Purchase urine ketone strips if instructed by MD and check urine ketones every am:  If + increase bedtime snack to 1 protein and 2 carbohydrate servings ?Walk 20-30 minutes at least 5 x week if permitted by MD ? ?Expected Outcomes:  Demonstrated interest in learning. Expect positive outcomes ? ?Education material provided:  ?Diabetes Management for Mothers-To-Be Booklet ?Gestational Meal Planning Guidelines ?Simple Meal Plan ?3 Day Food Record ?Goals for a Healthy Pregnancy ?Healthy Snack Choices (ADA) ? ?If problems or questions, patient to contact team via:   ?Sharion Settler, RN, CCM, CDCES [phone] ? ?Future DSME appointment:  (1 week) ?March 20, 203 with the dietitian ?

## 2021-04-26 ENCOUNTER — Encounter: Payer: Self-pay | Admitting: Licensed Practical Nurse

## 2021-04-26 ENCOUNTER — Ambulatory Visit (INDEPENDENT_AMBULATORY_CARE_PROVIDER_SITE_OTHER): Payer: Medicaid Other | Admitting: Licensed Practical Nurse

## 2021-04-26 VITALS — BP 142/72 | Wt 239.0 lb

## 2021-04-26 DIAGNOSIS — Z3A16 16 weeks gestation of pregnancy: Secondary | ICD-10-CM

## 2021-04-26 DIAGNOSIS — O0990 Supervision of high risk pregnancy, unspecified, unspecified trimester: Secondary | ICD-10-CM

## 2021-04-26 DIAGNOSIS — O10919 Unspecified pre-existing hypertension complicating pregnancy, unspecified trimester: Secondary | ICD-10-CM

## 2021-04-26 DIAGNOSIS — E119 Type 2 diabetes mellitus without complications: Secondary | ICD-10-CM

## 2021-04-26 MED ORDER — NIFEDIPINE ER OSMOTIC RELEASE 60 MG PO TB24: 60.0000 mg | ORAL_TABLET | Freq: Every day | ORAL | 0 refills | Status: DC

## 2021-04-26 NOTE — Progress Notes (Signed)
Routine Prenatal Care Visit ? ?Subjective  ?Courtney Burnett is a 31 y.o. G2P0102 at [redacted]w[redacted]d being seen today for ongoing prenatal care.  She is currently monitored for the following issues for this high-risk pregnancy and has Flank pain; Chronic hypertension affecting pregnancy; Supervision of high risk pregnancy, antepartum; Anxiety; Obesity affecting pregnancy; and History of cesarean section, classical on their problem list.  ?----------------------------------------------------------------------------------- ?Patient reports no complaints.  Doing ok. Saw DM educator, feels comfortable checking sugars and managing diet, states she knew what foods to eat but was eating the wrong amounts. This morning's fasting was 108 ?Note from DM educator: ?FBG's                          103-122 mg/dL 5/5 elevated ?pp breakfast               102-185 mg/dL 3/5 elevated ?pp lunch                       89-163 mg/dL 1/6 elevated ?pp supper                  100-141 mg/dL 4/6 elevated ? ?Now taking Metformin twice a day, tolerating it well.  Resistant to starting insulin.  Willing to tweak diet, has been trying to increase physical activity. Will continue metformin twice a day, aware that insulin may be needed.  ? ?Taking Procardia twice a day, denies HA or visual disturbances BP yesterday was 134/86, this am 142/72 ? ?Was seen in the ED last week for elevated BP and SOB, was given an inhaler.   ?  ?Contractions: Not present. Vag. Bleeding: None.  Movement: Present. Leaking Fluid denies.  ?----------------------------------------------------------------------------------- ?The following portions of the patient's history were reviewed and updated as appropriate: allergies, current medications, past family history, past medical history, past social history, past surgical history and problem list. Problem list updated. ? ?Objective  ?Blood pressure (!) 142/72, weight 239 lb (108.4 kg), last menstrual period 01/03/2021. ?Pregravid weight 255 lb  (115.7 kg) Total Weight Gain -16 lb (-7.258 kg) ?Urinalysis: Urine Protein    Urine Glucose   ? ?Fetal Status: Fetal Heart Rate (bpm): 150   Movement: Present    ? ?General:  Alert, oriented and cooperative. Patient is in no acute distress.  ?Skin: Skin is warm and dry. No rash noted.   ?Cardiovascular: Normal heart rate noted  ?Respiratory: Normal respiratory effort, no problems with respiration noted  ?Abdomen: Soft, gravid, appropriate for gestational age. Pain/Pressure: Absent     ?Pelvic:  Cervical exam deferred        ?Extremities: Normal range of motion.     ?Mental Status: Normal mood and affect. Normal behavior. Normal judgment and thought content.  ? ?Assessment  ? ?31 y.o. [id number] at [redacted]w[redacted]d by  10/10/2021, by Last Menstrual Period presenting for routine prenatal visit ? ?Plan  ? ?February 2023 Problems (from 03/16/21 to present)   ? ? Problem Noted Resolved  ? Obesity affecting pregnancy 04/07/2021 by Rod Can, CNM No  ? Supervision of high risk pregnancy, antepartum 03/17/2021 by Allen Derry, CNM No  ? Overview Addendum 03/17/2021  2:41 PM by Allen Derry, CNM  ?   ?Nursing Staff Provider  ?Office Location  [facility] Dating    ?Language  English  Anatomy US    ?Flu Vaccine  declined Genetic Screen  NIPS:   ?TDaP vaccine    Hgb A1C or  ?  GTT Early : 6.9 ?Third trimester :   ?Covid declined   LAB RESULTS   ?Rhogam  NA  Blood Type O/Positive/-- (02/01 1600)   ?Feeding Plan Breast  Antibody Negative (02/01 1600)  ?Contraception  Rubella 11.60 (02/01 1600)  ?Circumcision  RPR Non Reactive (02/01 1600)   ?Pediatrician   HBsAg Negative (02/01 1600)   ?Support Person  HIV Non Reactive (02/01 1600)  ?Prenatal Classes  Varicella immune  ?  GBS  (For PCN allergy, check sensitivities)   ?BTL Consent     ?VBAC Consent  Pap    ?  Hgb Electro    ?Pelvis Tested  CF   ?   SMA   ?     ? ?BMI >=40 ?[ ]  early 1h gtt -  ?[ ]  screen sleep apnea ?[ ]  anesthesia consult (early and late if BMI > 45) ?[ ]  u/s for  dating [ ]   ?[ ]  nutritional goals ?[ ]  folic acid 1mg  ?[ ]  bASA (>12 weeks) ?[ ]  consider nutrition consult ?[ ]  consider maternal EKG 1st trimester ?[ ]  Growth u/s 28 [ ] , 32 [ ] , 36 weeks [ ]  ?[ ]  NST/AFI weekly 34+ weeks (34[] ,35[] ,36[] , 37[] , 38[] , 39[] , 40[] ) ?[ ]  IOL by 41 weeks (scheduled, prn [] ) ? ?Obesity, CHTN, prior classical C/S, Anxiety  ?  ?  ? ?  ?  ? ?general obstetric precautions including but not limited to vaginal bleeding, contractions, leaking of fluid and fetal movement were reviewed in detail with the patient. ?Please refer to After Visit Summary for other counseling recommendations.  ? ?Remainder of prenatal care to be with MFM in [place] ? ?Roberto Scales, CNM  ?Mosetta Pigeon, [facility]  ?04/26/21  ?9:56 AM  ? ? ?

## 2021-04-27 ENCOUNTER — Telehealth: Payer: Self-pay | Admitting: Licensed Practical Nurse

## 2021-04-27 ENCOUNTER — Telehealth: Payer: Self-pay

## 2021-04-27 NOTE — Telephone Encounter (Signed)
LVM: calling to discuss treatment plan. You should be taking Procardia XL 60mg  once  a day-rather than taking 30mg  twice a day.  And I would like to discuss starting insulin.  Please call and schedule an appointment with Dr or for later this week or Monday. ?Bjorn Pippin, CNM  ?Monday, [facility]  ?@TODAY @  ?4:32 PM  ? ?

## 2021-04-27 NOTE — Telephone Encounter (Signed)
Per LMD Please call the pt to schedule an apt next week to see one of the doctors to start insulin and check her BP, thanks. I called and left voice message and sent mychart message to the pt. I plan to call her again later today. I contacted patient via phone. Left voicemail for patient to call back to schedule.

## 2021-05-02 ENCOUNTER — Encounter: Payer: Medicaid Other | Admitting: Dietician

## 2021-05-02 ENCOUNTER — Encounter: Payer: Self-pay | Admitting: Dietician

## 2021-05-02 ENCOUNTER — Other Ambulatory Visit: Payer: Self-pay

## 2021-05-02 VITALS — BP 124/74 | Ht 64.0 in | Wt 235.0 lb

## 2021-05-02 DIAGNOSIS — O24119 Pre-existing diabetes mellitus, type 2, in pregnancy, unspecified trimester: Secondary | ICD-10-CM

## 2021-05-02 DIAGNOSIS — O24111 Pre-existing diabetes mellitus, type 2, in pregnancy, first trimester: Secondary | ICD-10-CM | POA: Diagnosis not present

## 2021-05-02 NOTE — Progress Notes (Signed)
Patient's BG record indicates fasting BGs ranging 94-108, and post-meal BGs ranging 91-111 + one of 20 readings at 125. ?Patient's food diary indicates low carb intake with most meals, generally healthy food choices; she has eliminated sweets and sugar sweetened beverages.  ?Provided basic balanced meal plan, and wrote individualized menus based on patient's food preferences. ?Discussed including evening snack with small portion carb + protein in effort to further improve fasting BGs (patient stays awake for about 5 hours after her supper meal). ?Instructed patient on food safety, including avoidance of Listeriosis, and limiting mercury from fish. ?Discussed importance of maintaining healthy lifestyle habits to control Diabetes and increase chance of remission. ?  ?

## 2021-05-02 NOTE — Progress Notes (Signed)
Pt's visit reviewed with Dr Jerene Pitch on 3/14 around 3pm. Per Dr Bjorn Pippin Pt should be taking Procardia XL 60mg  once a day, she may need to increase her dose as her blood pressure is not yet regulated. The pt's blood sugars are out of range, she should be started on insulin, rec scheduling apt with MD to start insulin.  ?Pt called and LVM and mychart message explaining the above recommendations.  ? , CNM  ?Carie Caddy, [facility]  ?05/02/21  ?9:07 AM  ? ?

## 2021-05-02 NOTE — Patient Instructions (Addendum)
Keep up the healthy meals and snacks, and regular exercise! Great job! ?Add a small bedtime snack with protein and a small amount of carb; see if this helps morning blood sugars improve to <100.  ?Call with any questions or concerns. ? ?

## 2021-05-02 NOTE — Telephone Encounter (Signed)
Called and left voicemail for patient to call back to be scheduled. 

## 2021-05-03 NOTE — Telephone Encounter (Signed)
Called and left voicemail for patient to call back to be scheduled. There has been multiple attempts to reach this patient. No answer no call back. Please advise

## 2021-05-09 ENCOUNTER — Encounter: Payer: Medicaid Other | Admitting: Obstetrics and Gynecology

## 2021-05-10 ENCOUNTER — Ambulatory Visit (INDEPENDENT_AMBULATORY_CARE_PROVIDER_SITE_OTHER): Payer: Medicaid Other | Admitting: Family Medicine

## 2021-05-10 ENCOUNTER — Ambulatory Visit: Payer: Medicaid Other | Attending: Obstetrics and Gynecology

## 2021-05-10 ENCOUNTER — Encounter: Payer: Self-pay | Admitting: *Deleted

## 2021-05-10 ENCOUNTER — Other Ambulatory Visit: Payer: Self-pay | Admitting: Obstetrics and Gynecology

## 2021-05-10 ENCOUNTER — Encounter: Payer: Self-pay | Admitting: Family Medicine

## 2021-05-10 ENCOUNTER — Other Ambulatory Visit: Payer: Self-pay

## 2021-05-10 ENCOUNTER — Ambulatory Visit: Payer: Medicaid Other | Admitting: *Deleted

## 2021-05-10 VITALS — BP 135/85 | HR 88 | Wt 233.6 lb

## 2021-05-10 VITALS — BP 138/76 | HR 82

## 2021-05-10 DIAGNOSIS — O10012 Pre-existing essential hypertension complicating pregnancy, second trimester: Secondary | ICD-10-CM | POA: Diagnosis not present

## 2021-05-10 DIAGNOSIS — O10912 Unspecified pre-existing hypertension complicating pregnancy, second trimester: Secondary | ICD-10-CM

## 2021-05-10 DIAGNOSIS — Z8751 Personal history of pre-term labor: Secondary | ICD-10-CM | POA: Insufficient documentation

## 2021-05-10 DIAGNOSIS — O24119 Pre-existing diabetes mellitus, type 2, in pregnancy, unspecified trimester: Secondary | ICD-10-CM | POA: Diagnosis not present

## 2021-05-10 DIAGNOSIS — O34219 Maternal care for unspecified type scar from previous cesarean delivery: Secondary | ICD-10-CM

## 2021-05-10 DIAGNOSIS — O24112 Pre-existing diabetes mellitus, type 2, in pregnancy, second trimester: Secondary | ICD-10-CM | POA: Diagnosis not present

## 2021-05-10 DIAGNOSIS — E669 Obesity, unspecified: Secondary | ICD-10-CM | POA: Diagnosis not present

## 2021-05-10 DIAGNOSIS — O24912 Unspecified diabetes mellitus in pregnancy, second trimester: Secondary | ICD-10-CM | POA: Insufficient documentation

## 2021-05-10 DIAGNOSIS — O0990 Supervision of high risk pregnancy, unspecified, unspecified trimester: Secondary | ICD-10-CM | POA: Diagnosis present

## 2021-05-10 DIAGNOSIS — O99212 Obesity complicating pregnancy, second trimester: Secondary | ICD-10-CM | POA: Diagnosis not present

## 2021-05-10 DIAGNOSIS — O09292 Supervision of pregnancy with other poor reproductive or obstetric history, second trimester: Secondary | ICD-10-CM

## 2021-05-10 DIAGNOSIS — O09212 Supervision of pregnancy with history of pre-term labor, second trimester: Secondary | ICD-10-CM | POA: Diagnosis not present

## 2021-05-10 DIAGNOSIS — Z3481 Encounter for supervision of other normal pregnancy, first trimester: Secondary | ICD-10-CM | POA: Diagnosis not present

## 2021-05-10 DIAGNOSIS — O10919 Unspecified pre-existing hypertension complicating pregnancy, unspecified trimester: Secondary | ICD-10-CM

## 2021-05-10 DIAGNOSIS — Z3A18 18 weeks gestation of pregnancy: Secondary | ICD-10-CM | POA: Diagnosis not present

## 2021-05-10 DIAGNOSIS — Z98891 History of uterine scar from previous surgery: Secondary | ICD-10-CM

## 2021-05-10 NOTE — Patient Instructions (Signed)
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
## 2021-05-11 ENCOUNTER — Encounter: Payer: Self-pay | Admitting: Licensed Practical Nurse

## 2021-05-11 ENCOUNTER — Ambulatory Visit: Payer: Medicaid Other | Admitting: Dietician

## 2021-05-12 LAB — AFP, SERUM, OPEN SPINA BIFIDA
AFP MoM: 1.01
AFP Value: 35.3 ng/mL
Gest. Age on Collection Date: 18.1 weeks
Maternal Age At EDD: 30.7 yr
OSBR Risk 1 IN: 10000
Test Results:: NEGATIVE
Weight: 234 [lb_av]

## 2021-05-17 ENCOUNTER — Ambulatory Visit: Payer: Medicaid Other | Attending: Obstetrics and Gynecology

## 2021-05-17 ENCOUNTER — Ambulatory Visit: Payer: Medicaid Other | Admitting: *Deleted

## 2021-05-17 VITALS — BP 133/78 | HR 80

## 2021-05-17 DIAGNOSIS — O34218 Maternal care for other type scar from previous cesarean delivery: Secondary | ICD-10-CM | POA: Diagnosis not present

## 2021-05-17 DIAGNOSIS — O24112 Pre-existing diabetes mellitus, type 2, in pregnancy, second trimester: Secondary | ICD-10-CM

## 2021-05-17 DIAGNOSIS — Z3A19 19 weeks gestation of pregnancy: Secondary | ICD-10-CM

## 2021-05-17 DIAGNOSIS — O0990 Supervision of high risk pregnancy, unspecified, unspecified trimester: Secondary | ICD-10-CM | POA: Insufficient documentation

## 2021-05-17 DIAGNOSIS — E669 Obesity, unspecified: Secondary | ICD-10-CM

## 2021-05-17 DIAGNOSIS — O09899 Supervision of other high risk pregnancies, unspecified trimester: Secondary | ICD-10-CM | POA: Diagnosis present

## 2021-05-17 DIAGNOSIS — O24911 Unspecified diabetes mellitus in pregnancy, first trimester: Secondary | ICD-10-CM | POA: Diagnosis present

## 2021-05-17 DIAGNOSIS — O09212 Supervision of pregnancy with history of pre-term labor, second trimester: Secondary | ICD-10-CM | POA: Diagnosis not present

## 2021-05-17 DIAGNOSIS — O10012 Pre-existing essential hypertension complicating pregnancy, second trimester: Secondary | ICD-10-CM | POA: Diagnosis not present

## 2021-05-17 DIAGNOSIS — O10911 Unspecified pre-existing hypertension complicating pregnancy, first trimester: Secondary | ICD-10-CM | POA: Diagnosis not present

## 2021-05-17 DIAGNOSIS — O09292 Supervision of pregnancy with other poor reproductive or obstetric history, second trimester: Secondary | ICD-10-CM | POA: Diagnosis not present

## 2021-05-17 DIAGNOSIS — O99212 Obesity complicating pregnancy, second trimester: Secondary | ICD-10-CM

## 2021-05-18 ENCOUNTER — Other Ambulatory Visit: Payer: Self-pay | Admitting: *Deleted

## 2021-05-18 DIAGNOSIS — O24112 Pre-existing diabetes mellitus, type 2, in pregnancy, second trimester: Secondary | ICD-10-CM

## 2021-05-18 DIAGNOSIS — Z6841 Body Mass Index (BMI) 40.0 and over, adult: Secondary | ICD-10-CM

## 2021-05-18 DIAGNOSIS — O09899 Supervision of other high risk pregnancies, unspecified trimester: Secondary | ICD-10-CM

## 2021-05-18 DIAGNOSIS — O10919 Unspecified pre-existing hypertension complicating pregnancy, unspecified trimester: Secondary | ICD-10-CM

## 2021-05-18 NOTE — Progress Notes (Unsigned)
U

## 2021-05-24 ENCOUNTER — Other Ambulatory Visit: Payer: Self-pay | Admitting: Licensed Practical Nurse

## 2021-05-24 DIAGNOSIS — F419 Anxiety disorder, unspecified: Secondary | ICD-10-CM

## 2021-05-26 ENCOUNTER — Other Ambulatory Visit: Payer: Self-pay | Admitting: Licensed Practical Nurse

## 2021-05-26 DIAGNOSIS — F419 Anxiety disorder, unspecified: Secondary | ICD-10-CM

## 2021-05-26 MED ORDER — SERTRALINE HCL 50 MG PO TABS: 50.0000 mg | ORAL_TABLET | Freq: Every day | ORAL | 1 refills | Status: DC

## 2021-05-26 MED ORDER — SERTRALINE HCL 25 MG PO TABS: 25.0000 mg | ORAL_TABLET | Freq: Every day | ORAL | 0 refills | Status: DC

## 2021-06-08 ENCOUNTER — Ambulatory Visit: Payer: Medicaid Other | Admitting: *Deleted

## 2021-06-08 ENCOUNTER — Ambulatory Visit: Payer: Medicaid Other | Attending: Obstetrics

## 2021-06-08 VITALS — BP 126/66 | HR 86

## 2021-06-08 DIAGNOSIS — O09292 Supervision of pregnancy with other poor reproductive or obstetric history, second trimester: Secondary | ICD-10-CM | POA: Diagnosis not present

## 2021-06-08 DIAGNOSIS — O24112 Pre-existing diabetes mellitus, type 2, in pregnancy, second trimester: Secondary | ICD-10-CM

## 2021-06-08 DIAGNOSIS — O34219 Maternal care for unspecified type scar from previous cesarean delivery: Secondary | ICD-10-CM

## 2021-06-08 DIAGNOSIS — O09212 Supervision of pregnancy with history of pre-term labor, second trimester: Secondary | ICD-10-CM

## 2021-06-08 DIAGNOSIS — O99212 Obesity complicating pregnancy, second trimester: Secondary | ICD-10-CM

## 2021-06-08 DIAGNOSIS — O0990 Supervision of high risk pregnancy, unspecified, unspecified trimester: Secondary | ICD-10-CM | POA: Insufficient documentation

## 2021-06-08 DIAGNOSIS — E669 Obesity, unspecified: Secondary | ICD-10-CM

## 2021-06-08 DIAGNOSIS — Z6841 Body Mass Index (BMI) 40.0 and over, adult: Secondary | ICD-10-CM | POA: Insufficient documentation

## 2021-06-08 DIAGNOSIS — O10012 Pre-existing essential hypertension complicating pregnancy, second trimester: Secondary | ICD-10-CM | POA: Diagnosis not present

## 2021-06-08 DIAGNOSIS — E119 Type 2 diabetes mellitus without complications: Secondary | ICD-10-CM

## 2021-06-08 DIAGNOSIS — O09899 Supervision of other high risk pregnancies, unspecified trimester: Secondary | ICD-10-CM | POA: Insufficient documentation

## 2021-06-08 DIAGNOSIS — O10919 Unspecified pre-existing hypertension complicating pregnancy, unspecified trimester: Secondary | ICD-10-CM | POA: Insufficient documentation

## 2021-06-08 DIAGNOSIS — Z3A22 22 weeks gestation of pregnancy: Secondary | ICD-10-CM

## 2021-06-09 ENCOUNTER — Encounter: Payer: Self-pay | Admitting: Family Medicine

## 2021-06-09 ENCOUNTER — Other Ambulatory Visit: Payer: Self-pay | Admitting: *Deleted

## 2021-06-09 ENCOUNTER — Ambulatory Visit (INDEPENDENT_AMBULATORY_CARE_PROVIDER_SITE_OTHER): Payer: Medicaid Other | Admitting: Family Medicine

## 2021-06-09 VITALS — BP 137/82 | HR 87 | Wt 222.2 lb

## 2021-06-09 DIAGNOSIS — F32A Depression, unspecified: Secondary | ICD-10-CM

## 2021-06-09 DIAGNOSIS — O24112 Pre-existing diabetes mellitus, type 2, in pregnancy, second trimester: Secondary | ICD-10-CM

## 2021-06-09 DIAGNOSIS — O365990 Maternal care for other known or suspected poor fetal growth, unspecified trimester, not applicable or unspecified: Secondary | ICD-10-CM | POA: Insufficient documentation

## 2021-06-09 DIAGNOSIS — O10919 Unspecified pre-existing hypertension complicating pregnancy, unspecified trimester: Secondary | ICD-10-CM

## 2021-06-09 DIAGNOSIS — Z8751 Personal history of pre-term labor: Secondary | ICD-10-CM

## 2021-06-09 DIAGNOSIS — O34219 Maternal care for unspecified type scar from previous cesarean delivery: Secondary | ICD-10-CM

## 2021-06-09 DIAGNOSIS — O0990 Supervision of high risk pregnancy, unspecified, unspecified trimester: Secondary | ICD-10-CM

## 2021-06-09 DIAGNOSIS — O09899 Supervision of other high risk pregnancies, unspecified trimester: Secondary | ICD-10-CM

## 2021-06-09 DIAGNOSIS — Z98891 History of uterine scar from previous surgery: Secondary | ICD-10-CM

## 2021-06-09 DIAGNOSIS — F419 Anxiety disorder, unspecified: Secondary | ICD-10-CM

## 2021-06-09 DIAGNOSIS — Z362 Encounter for other antenatal screening follow-up: Secondary | ICD-10-CM

## 2021-06-09 MED ORDER — SERTRALINE HCL 100 MG PO TABS: 100.0000 mg | ORAL_TABLET | Freq: Every day | ORAL | 2 refills | Status: DC

## 2021-06-09 NOTE — Patient Instructions (Signed)

## 2021-06-09 NOTE — Progress Notes (Signed)
? ?  PRENATAL VISIT NOTE ? ?Subjective:  ?Courtney Burnett is a 31 y.o. G2P0102 at [redacted]w[redacted]d being seen today for ongoing prenatal care.  She is currently monitored for the following issues for this low-risk pregnancy and has Flank pain; Chronic hypertension affecting pregnancy; Supervision of high risk pregnancy, antepartum; Anxiety; Obesity affecting pregnancy; History of cesarean section, classical; Type 2 diabetes mellitus in pregnancy; History of preterm delivery; and Fetal growth restriction antepartum on their problem list. ? ?Patient reports  anxiety .  Contractions: Not present. Vag. Bleeding: None.  Movement: Present. Denies leaking of fluid.  ? ?The following portions of the patient's history were reviewed and updated as appropriate: allergies, current medications, past family history, past medical history, past social history, past surgical history and problem list.  ? ?Objective:  ? ?Vitals:  ? 06/09/21 1025  ?BP: 137/82  ?Pulse: 87  ?Weight: 222 lb 3.2 oz (100.8 kg)  ? ? ?Fetal Status: Fetal Heart Rate (bpm): 140   Movement: Present    ? ?General:  Alert, oriented and cooperative. Patient is in no acute distress.  ?Skin: Skin is warm and dry. No rash noted.   ?Cardiovascular: Normal heart rate noted  ?Respiratory: Normal respiratory effort, no problems with respiration noted  ?Abdomen: Soft, gravid, appropriate for gestational age.  Pain/Pressure: Present     ?Pelvic: Cervical exam deferred        ?Extremities: Normal range of motion.  Edema: Trace  ?Mental Status: Normal mood and affect. Normal behavior. Normal judgment and thought content.  ? ?Assessment and Plan:  ?Pregnancy: G2P0102 at [redacted]w[redacted]d ?1. Chronic hypertension affecting pregnancy ?On Procardia, with good control ?On ASA ? ?2. Pregnancy with type 2 diabetes mellitus in second trimester ?FBS 95, 85, 86, 88 ?2 hour pp 103, 90, 96, 91, 103, 85, 90, 100, 87, 93, 102, 87, 103, 108  ?Continue Metformin 750 mg bid ? ?3. Supervision of high risk pregnancy,  antepartum ? ? ?4. History of cesarean section, classical ?For RCS ? ?5. History of preterm delivery ?Due to labor and pre-eclampsia ? ?6. Fetal growth restriction antepartum ?New onset at 9% ?Weekly dopplers with MFM ? ?8. Anxiety and depression ?Increase Zoloft from 75 mg to 100 mg ?- sertraline (ZOLOFT) 100 MG tablet; Take 1 tablet (100 mg total) by mouth daily. After taking 25mg  daily x 7 days, take 50mg  daily  Dispense: 90 tablet; Refill: 2 ? ?Preterm labor symptoms and general obstetric precautions including but not limited to vaginal bleeding, contractions, leaking of fluid and fetal movement were reviewed in detail with the patient. ?Please refer to After Visit Summary for other counseling recommendations.  ? ?Return in 3 weeks (on 06/30/2021) for [facility]--can go to [facility] office. ? ?Future Appointments  ?Date Time Provider Department Center  ?06/16/2021  1:30 PM [facility] NURSE [facility]  ?06/16/2021  1:45 PM [facility] US4 [facility]  ?06/21/2021  1:30 PM [facility] NURSE [facility]  ?06/21/2021  1:45 PM [facility] US4 [facility]  ?06/21/2021  2:15 PM [facility] NST [facility]  ?06/28/2021  1:30 PM [facility] NURSE [facility]  ?06/28/2021  1:45 PM [facility] US4 [facility]  ?06/28/2021  2:15 PM [facility] NST [facility]  ? ? ?06/30/2021, MD ? ?

## 2021-06-16 ENCOUNTER — Other Ambulatory Visit: Payer: Self-pay | Admitting: *Deleted

## 2021-06-16 ENCOUNTER — Ambulatory Visit: Payer: Medicaid Other | Admitting: *Deleted

## 2021-06-16 ENCOUNTER — Ambulatory Visit: Payer: Medicaid Other | Attending: Maternal & Fetal Medicine

## 2021-06-16 VITALS — BP 133/74 | HR 89

## 2021-06-16 DIAGNOSIS — O09212 Supervision of pregnancy with history of pre-term labor, second trimester: Secondary | ICD-10-CM

## 2021-06-16 DIAGNOSIS — O365990 Maternal care for other known or suspected poor fetal growth, unspecified trimester, not applicable or unspecified: Secondary | ICD-10-CM

## 2021-06-16 DIAGNOSIS — O34219 Maternal care for unspecified type scar from previous cesarean delivery: Secondary | ICD-10-CM | POA: Diagnosis not present

## 2021-06-16 DIAGNOSIS — Z362 Encounter for other antenatal screening follow-up: Secondary | ICD-10-CM | POA: Insufficient documentation

## 2021-06-16 DIAGNOSIS — O10912 Unspecified pre-existing hypertension complicating pregnancy, second trimester: Secondary | ICD-10-CM

## 2021-06-16 DIAGNOSIS — E669 Obesity, unspecified: Secondary | ICD-10-CM | POA: Diagnosis not present

## 2021-06-16 DIAGNOSIS — O10012 Pre-existing essential hypertension complicating pregnancy, second trimester: Secondary | ICD-10-CM | POA: Diagnosis not present

## 2021-06-16 DIAGNOSIS — O09899 Supervision of other high risk pregnancies, unspecified trimester: Secondary | ICD-10-CM | POA: Insufficient documentation

## 2021-06-16 DIAGNOSIS — O99212 Obesity complicating pregnancy, second trimester: Secondary | ICD-10-CM

## 2021-06-16 DIAGNOSIS — E119 Type 2 diabetes mellitus without complications: Secondary | ICD-10-CM | POA: Diagnosis not present

## 2021-06-16 DIAGNOSIS — O0990 Supervision of high risk pregnancy, unspecified, unspecified trimester: Secondary | ICD-10-CM

## 2021-06-16 DIAGNOSIS — Z3A23 23 weeks gestation of pregnancy: Secondary | ICD-10-CM | POA: Diagnosis not present

## 2021-06-16 DIAGNOSIS — O24112 Pre-existing diabetes mellitus, type 2, in pregnancy, second trimester: Secondary | ICD-10-CM | POA: Diagnosis not present

## 2021-06-21 ENCOUNTER — Ambulatory Visit: Payer: Medicaid Other | Attending: Maternal & Fetal Medicine | Admitting: *Deleted

## 2021-06-21 ENCOUNTER — Ambulatory Visit: Payer: Medicaid Other

## 2021-06-21 ENCOUNTER — Ambulatory Visit (HOSPITAL_BASED_OUTPATIENT_CLINIC_OR_DEPARTMENT_OTHER): Payer: Medicaid Other

## 2021-06-21 VITALS — BP 133/76 | HR 87

## 2021-06-21 DIAGNOSIS — O24112 Pre-existing diabetes mellitus, type 2, in pregnancy, second trimester: Secondary | ICD-10-CM | POA: Diagnosis not present

## 2021-06-21 DIAGNOSIS — O09899 Supervision of other high risk pregnancies, unspecified trimester: Secondary | ICD-10-CM | POA: Insufficient documentation

## 2021-06-21 DIAGNOSIS — O0990 Supervision of high risk pregnancy, unspecified, unspecified trimester: Secondary | ICD-10-CM

## 2021-06-21 DIAGNOSIS — Z3A23 23 weeks gestation of pregnancy: Secondary | ICD-10-CM | POA: Insufficient documentation

## 2021-06-21 DIAGNOSIS — O34219 Maternal care for unspecified type scar from previous cesarean delivery: Secondary | ICD-10-CM | POA: Insufficient documentation

## 2021-06-21 DIAGNOSIS — Z362 Encounter for other antenatal screening follow-up: Secondary | ICD-10-CM

## 2021-06-21 DIAGNOSIS — O365990 Maternal care for other known or suspected poor fetal growth, unspecified trimester, not applicable or unspecified: Secondary | ICD-10-CM

## 2021-06-21 DIAGNOSIS — O99212 Obesity complicating pregnancy, second trimester: Secondary | ICD-10-CM

## 2021-06-27 ENCOUNTER — Encounter: Payer: Self-pay | Admitting: Family Medicine

## 2021-06-27 DIAGNOSIS — O0990 Supervision of high risk pregnancy, unspecified, unspecified trimester: Secondary | ICD-10-CM

## 2021-06-28 ENCOUNTER — Ambulatory Visit (HOSPITAL_BASED_OUTPATIENT_CLINIC_OR_DEPARTMENT_OTHER): Payer: Medicaid Other

## 2021-06-28 ENCOUNTER — Other Ambulatory Visit: Payer: Self-pay | Admitting: Maternal & Fetal Medicine

## 2021-06-28 ENCOUNTER — Ambulatory Visit: Payer: Medicaid Other

## 2021-06-28 ENCOUNTER — Other Ambulatory Visit: Payer: Medicaid Other

## 2021-06-28 ENCOUNTER — Ambulatory Visit: Payer: Medicaid Other | Attending: Maternal & Fetal Medicine | Admitting: *Deleted

## 2021-06-28 VITALS — BP 133/78 | HR 92

## 2021-06-28 DIAGNOSIS — Z3A25 25 weeks gestation of pregnancy: Secondary | ICD-10-CM | POA: Diagnosis not present

## 2021-06-28 DIAGNOSIS — O3432 Maternal care for cervical incompetence, second trimester: Secondary | ICD-10-CM | POA: Diagnosis not present

## 2021-06-28 DIAGNOSIS — O09292 Supervision of pregnancy with other poor reproductive or obstetric history, second trimester: Secondary | ICD-10-CM | POA: Diagnosis not present

## 2021-06-28 DIAGNOSIS — O34219 Maternal care for unspecified type scar from previous cesarean delivery: Secondary | ICD-10-CM | POA: Insufficient documentation

## 2021-06-28 DIAGNOSIS — O24112 Pre-existing diabetes mellitus, type 2, in pregnancy, second trimester: Secondary | ICD-10-CM

## 2021-06-28 DIAGNOSIS — O99212 Obesity complicating pregnancy, second trimester: Secondary | ICD-10-CM | POA: Insufficient documentation

## 2021-06-28 DIAGNOSIS — O365990 Maternal care for other known or suspected poor fetal growth, unspecified trimester, not applicable or unspecified: Secondary | ICD-10-CM

## 2021-06-28 DIAGNOSIS — O10912 Unspecified pre-existing hypertension complicating pregnancy, second trimester: Secondary | ICD-10-CM

## 2021-06-28 DIAGNOSIS — O09892 Supervision of other high risk pregnancies, second trimester: Secondary | ICD-10-CM | POA: Insufficient documentation

## 2021-06-28 DIAGNOSIS — O0990 Supervision of high risk pregnancy, unspecified, unspecified trimester: Secondary | ICD-10-CM

## 2021-06-28 DIAGNOSIS — Z362 Encounter for other antenatal screening follow-up: Secondary | ICD-10-CM

## 2021-06-28 DIAGNOSIS — O09899 Supervision of other high risk pregnancies, unspecified trimester: Secondary | ICD-10-CM

## 2021-06-28 MED ORDER — PREPLUS 27-1 MG PO TABS: 1.0000 | ORAL_TABLET | Freq: Every day | ORAL | 11 refills | Status: AC

## 2021-07-06 ENCOUNTER — Ambulatory Visit: Payer: Medicaid Other

## 2021-07-10 ENCOUNTER — Other Ambulatory Visit: Payer: Self-pay | Admitting: Licensed Practical Nurse

## 2021-07-10 DIAGNOSIS — I10 Essential (primary) hypertension: Secondary | ICD-10-CM

## 2021-07-10 DIAGNOSIS — O0990 Supervision of high risk pregnancy, unspecified, unspecified trimester: Secondary | ICD-10-CM

## 2021-07-13 ENCOUNTER — Ambulatory Visit: Payer: Medicaid Other

## 2021-07-13 ENCOUNTER — Other Ambulatory Visit: Payer: Medicaid Other

## 2021-07-14 DIAGNOSIS — Z3A27 27 weeks gestation of pregnancy: Secondary | ICD-10-CM | POA: Diagnosis not present

## 2021-07-14 DIAGNOSIS — O24312 Unspecified pre-existing diabetes mellitus in pregnancy, second trimester: Secondary | ICD-10-CM | POA: Diagnosis not present

## 2021-07-18 ENCOUNTER — Telehealth: Payer: Self-pay

## 2021-07-18 NOTE — Telephone Encounter (Signed)
Left message for pt to call the office back to schedule prenatal.glucose testing with Dr. Shawnie Pons on 07/19/21 at 10:55.

## 2021-07-20 ENCOUNTER — Other Ambulatory Visit: Payer: Medicaid Other

## 2021-07-20 ENCOUNTER — Ambulatory Visit (INDEPENDENT_AMBULATORY_CARE_PROVIDER_SITE_OTHER): Payer: Medicaid Other | Admitting: Family Medicine

## 2021-07-20 ENCOUNTER — Encounter: Payer: Self-pay | Admitting: Family Medicine

## 2021-07-20 ENCOUNTER — Ambulatory Visit: Payer: Medicaid Other

## 2021-07-20 ENCOUNTER — Other Ambulatory Visit: Payer: Self-pay | Admitting: Maternal & Fetal Medicine

## 2021-07-20 ENCOUNTER — Other Ambulatory Visit: Payer: Self-pay | Admitting: *Deleted

## 2021-07-20 ENCOUNTER — Ambulatory Visit: Payer: Medicaid Other | Attending: Maternal & Fetal Medicine

## 2021-07-20 ENCOUNTER — Ambulatory Visit: Payer: Medicaid Other | Admitting: *Deleted

## 2021-07-20 VITALS — BP 128/65 | HR 84

## 2021-07-20 VITALS — BP 134/82 | HR 88 | Wt 223.0 lb

## 2021-07-20 DIAGNOSIS — O09293 Supervision of pregnancy with other poor reproductive or obstetric history, third trimester: Secondary | ICD-10-CM | POA: Diagnosis not present

## 2021-07-20 DIAGNOSIS — O24113 Pre-existing diabetes mellitus, type 2, in pregnancy, third trimester: Secondary | ICD-10-CM

## 2021-07-20 DIAGNOSIS — O24013 Pre-existing diabetes mellitus, type 1, in pregnancy, third trimester: Secondary | ICD-10-CM

## 2021-07-20 DIAGNOSIS — O10912 Unspecified pre-existing hypertension complicating pregnancy, second trimester: Secondary | ICD-10-CM

## 2021-07-20 DIAGNOSIS — O365990 Maternal care for other known or suspected poor fetal growth, unspecified trimester, not applicable or unspecified: Secondary | ICD-10-CM

## 2021-07-20 DIAGNOSIS — O0990 Supervision of high risk pregnancy, unspecified, unspecified trimester: Secondary | ICD-10-CM

## 2021-07-20 DIAGNOSIS — O10919 Unspecified pre-existing hypertension complicating pregnancy, unspecified trimester: Secondary | ICD-10-CM

## 2021-07-20 DIAGNOSIS — O10013 Pre-existing essential hypertension complicating pregnancy, third trimester: Secondary | ICD-10-CM | POA: Diagnosis not present

## 2021-07-20 DIAGNOSIS — Z3A28 28 weeks gestation of pregnancy: Secondary | ICD-10-CM | POA: Diagnosis not present

## 2021-07-20 DIAGNOSIS — O99212 Obesity complicating pregnancy, second trimester: Secondary | ICD-10-CM

## 2021-07-20 DIAGNOSIS — O09213 Supervision of pregnancy with history of pre-term labor, third trimester: Secondary | ICD-10-CM

## 2021-07-20 DIAGNOSIS — O24112 Pre-existing diabetes mellitus, type 2, in pregnancy, second trimester: Secondary | ICD-10-CM

## 2021-07-20 DIAGNOSIS — O10913 Unspecified pre-existing hypertension complicating pregnancy, third trimester: Secondary | ICD-10-CM

## 2021-07-20 DIAGNOSIS — O99213 Obesity complicating pregnancy, third trimester: Secondary | ICD-10-CM

## 2021-07-20 DIAGNOSIS — O34219 Maternal care for unspecified type scar from previous cesarean delivery: Secondary | ICD-10-CM

## 2021-07-20 DIAGNOSIS — Z98891 History of uterine scar from previous surgery: Secondary | ICD-10-CM

## 2021-07-20 DIAGNOSIS — E669 Obesity, unspecified: Secondary | ICD-10-CM | POA: Diagnosis not present

## 2021-07-20 DIAGNOSIS — F32A Depression, unspecified: Secondary | ICD-10-CM

## 2021-07-20 DIAGNOSIS — F419 Anxiety disorder, unspecified: Secondary | ICD-10-CM

## 2021-07-20 MED ORDER — SERTRALINE HCL 100 MG PO TABS: 150.0000 mg | ORAL_TABLET | Freq: Every day | ORAL | 2 refills | Status: AC

## 2021-07-20 MED ORDER — ASPIRIN 81 MG PO TBEC: 81.0000 mg | DELAYED_RELEASE_TABLET | Freq: Every day | ORAL | 3 refills | Status: DC

## 2021-07-20 NOTE — Progress Notes (Signed)
   PRENATAL VISIT NOTE  Subjective:  Courtney Burnett is a 31 y.o. G2P0102 at [redacted]w[redacted]d being seen today for ongoing prenatal care.  She is currently monitored for the following issues for this high-risk pregnancy and has Flank pain; Chronic hypertension affecting pregnancy; Supervision of high risk pregnancy, antepartum; Anxiety; Obesity affecting pregnancy; History of cesarean section, classical; Type 2 diabetes mellitus in pregnancy; History of preterm delivery; and Fetal growth restriction antepartum on their problem list.  Patient reports  still not feeling great on increased dose of Zoloft but is some better .  Contractions: Not present. Vag. Bleeding: None.  Movement: Present. Denies leaking of fluid.   The following portions of the patient's history were reviewed and updated as appropriate: allergies, current medications, past family history, past medical history, past social history, past surgical history and problem list.   Objective:   Vitals:   07/20/21 1059  BP: 134/82  Pulse: 88  Weight: 223 lb (101.2 kg)    Fetal Status: Fetal Heart Rate (bpm): 156 Fundal Height: 31 cm Movement: Present     General:  Alert, oriented and cooperative. Patient is in no acute distress.  Skin: Skin is warm and dry. No rash noted.   Cardiovascular: Normal heart rate noted  Respiratory: Normal respiratory effort, no problems with respiration noted  Abdomen: Soft, gravid, appropriate for gestational age.  Pain/Pressure: Absent     Pelvic: Cervical exam deferred        Extremities: Normal range of motion.  Edema: None  Mental Status: Normal mood and affect. Normal behavior. Normal judgment and thought content.   Assessment and Plan:  Pregnancy: [id number] at [redacted]w[redacted]d 1. Supervision of high risk pregnancy, antepartum   2. Chronic hypertension affecting pregnancy On Nifedipine - aspirin EC 81 MG tablet; Take 1 tablet (81 mg total) by mouth daily.  Dispense: 90 tablet; Refill: 3  3. Pregnancy with type 2  diabetes mellitus in third trimester FBS 97/89/94/91/86/87/100/92/91 2 hour pp 109/87/109/76/112/83/83/95/92/95/100/92/98/88/90/92 Continue metformin Has f/u u/s with MFM today - aspirin EC 81 MG tablet; Take 1 tablet (81 mg total) by mouth daily.  Dispense: 90 tablet; Refill: 3  4. History of cesarean section, classical For RCS at 37 weeks with BTL--papers signed today   5. Fetal growth restriction antepartum Last growth at 9%  6. Anxiety and depression Feels some better Increase to 150 mg daily - sertraline (ZOLOFT) 100 MG tablet; Take 1.5 tablets (150 mg total) by mouth daily.  Dispense: 150 tablet; Refill: 2  Preterm labor symptoms and general obstetric precautions including but not limited to vaginal bleeding, contractions, leaking of fluid and fetal movement were reviewed in detail with the patient. Please refer to After Visit Summary for other counseling recommendations.   Return in 2 weeks (on 08/03/2021).  Future Appointments  Date Time Provider [place]  07/20/2021  1:30 PM [facility] NURSE [facility]  07/20/2021  1:45 PM [facility] US4 [facility]  07/20/2021  3:15 PM [facility] NST [facility]  08/03/2021  8:15 AM Donnamae Jude, MD [facility]    Donnamae Jude, MD

## 2021-07-20 NOTE — Patient Instructions (Signed)

## 2021-07-20 NOTE — Progress Notes (Signed)
ROB [redacted]w[redacted]d  Tdap offered: pt declines   CC: None

## 2021-07-22 ENCOUNTER — Other Ambulatory Visit: Payer: Medicaid Other

## 2021-07-27 ENCOUNTER — Other Ambulatory Visit: Payer: Self-pay | Admitting: Licensed Practical Nurse

## 2021-07-27 DIAGNOSIS — O10919 Unspecified pre-existing hypertension complicating pregnancy, unspecified trimester: Secondary | ICD-10-CM

## 2021-07-28 ENCOUNTER — Other Ambulatory Visit: Payer: Self-pay | Admitting: *Deleted

## 2021-07-28 ENCOUNTER — Encounter: Payer: Self-pay | Admitting: Family Medicine

## 2021-07-28 DIAGNOSIS — O10919 Unspecified pre-existing hypertension complicating pregnancy, unspecified trimester: Secondary | ICD-10-CM

## 2021-07-28 MED ORDER — NIFEDIPINE ER OSMOTIC RELEASE 60 MG PO TB24: 60.0000 mg | ORAL_TABLET | Freq: Every day | ORAL | 3 refills | Status: DC

## 2021-08-03 ENCOUNTER — Ambulatory Visit (INDEPENDENT_AMBULATORY_CARE_PROVIDER_SITE_OTHER): Payer: Medicaid Other | Admitting: Family Medicine

## 2021-08-03 ENCOUNTER — Encounter: Payer: Self-pay | Admitting: Family Medicine

## 2021-08-03 VITALS — BP 129/80 | HR 91 | Wt 227.0 lb

## 2021-08-03 DIAGNOSIS — Z98891 History of uterine scar from previous surgery: Secondary | ICD-10-CM

## 2021-08-03 DIAGNOSIS — O365990 Maternal care for other known or suspected poor fetal growth, unspecified trimester, not applicable or unspecified: Secondary | ICD-10-CM

## 2021-08-03 DIAGNOSIS — O0990 Supervision of high risk pregnancy, unspecified, unspecified trimester: Secondary | ICD-10-CM

## 2021-08-03 DIAGNOSIS — Z8751 Personal history of pre-term labor: Secondary | ICD-10-CM

## 2021-08-03 DIAGNOSIS — O10919 Unspecified pre-existing hypertension complicating pregnancy, unspecified trimester: Secondary | ICD-10-CM

## 2021-08-03 DIAGNOSIS — K219 Gastro-esophageal reflux disease without esophagitis: Secondary | ICD-10-CM

## 2021-08-03 DIAGNOSIS — O24113 Pre-existing diabetes mellitus, type 2, in pregnancy, third trimester: Secondary | ICD-10-CM

## 2021-08-03 MED ORDER — OMEPRAZOLE MAGNESIUM 20 MG PO TBEC: 20.0000 mg | DELAYED_RELEASE_TABLET | Freq: Every day | ORAL | 3 refills | Status: DC

## 2021-08-03 NOTE — Progress Notes (Signed)
ROB [redacted]w[redacted]d  Pt brought blood sugar log. CC: None

## 2021-08-03 NOTE — Progress Notes (Signed)
ROB [redacted]w[redacted]d

## 2021-08-03 NOTE — Progress Notes (Signed)
   PRENATAL VISIT NOTE  Subjective:  Courtney Burnett is a 31 y.o. G2P0102 at [redacted]w[redacted]d being seen today for ongoing prenatal care.  She is currently monitored for the following issues for this low-risk pregnancy and has Flank pain; Chronic hypertension affecting pregnancy; Supervision of high risk pregnancy, antepartum; Anxiety; Obesity affecting pregnancy; History of cesarean section, classical; Type 2 diabetes mellitus in pregnancy; History of preterm delivery; and Fetal growth restriction antepartum on their problem list.  Patient reports no complaints.  Contractions: Not present. Vag. Bleeding: None.  Movement: Present. Denies leaking of fluid.   The following portions of the patient's history were reviewed and updated as appropriate: allergies, current medications, past family history, past medical history, past social history, past surgical history and problem list.   Objective:   Vitals:   08/03/21 0829  BP: 129/80  Pulse: 91  Weight: 227 lb (103 kg)    Fetal Status: Fetal Heart Rate (bpm): 138 Fundal Height: 31 cm Movement: Present     General:  Alert, oriented and cooperative. Patient is in no acute distress.  Skin: Skin is warm and dry. No rash noted.   Cardiovascular: Normal heart rate noted  Respiratory: Normal respiratory effort, no problems with respiration noted  Abdomen: Soft, gravid, appropriate for gestational age.  Pain/Pressure: Absent     Pelvic: Cervical exam deferred        Extremities: Normal range of motion.  Edema: None  Mental Status: Normal mood and affect. Normal behavior. Normal judgment and thought content.   Assessment and Plan:  Pregnancy: G2P0102 at [redacted]w[redacted]d 1. Chronic hypertension affecting pregnancy BP is well controlled on Procarida On ASA Last growth at 11 %  2. Pregnancy with type 2 diabetes mellitus in third trimester FBS 92, 95, 93, 84, 98, 93, 91 2 hour pp 91, 86, 118, 103, 93, 94, 81, 104, 84, 98, 101, 92, 106, 97 Continue metformin 750  bid Continue ASA  3. Supervision of high risk pregnancy, antepartum   4. History of cesarean section, classical Desires it at 61 6/7--but booked for 37 1/7 wks  5. Fetal growth restriction antepartum Last growth at 11 %--in testing starting at 32 weeks with MFM  6. History of preterm delivery   7. Gastroesophageal reflux disease without esophagitis Trial of PPI - omeprazole (PRILOSEC OTC) 20 MG tablet; Take 1 tablet (20 mg total) by mouth daily.  Dispense: 30 tablet; Refill: 3  Preterm labor symptoms and general obstetric precautions including but not limited to vaginal bleeding, contractions, leaking of fluid and fetal movement were reviewed in detail with the patient. Please refer to After Visit Summary for other counseling recommendations.   Return in about 2 weeks (around 08/17/2021) for [facility].  Future Appointments  Date Time Provider Department Center  08/10/2021  2:45 PM [facility] NURSE [facility]  08/10/2021  3:00 PM [facility] US1 [facility]  08/17/2021  2:15 PM [facility] NST [facility]  08/17/2021  3:30 PM Reva Bores, MD [facility]  08/24/2021  1:15 PM [facility] NST [facility]  08/31/2021  2:30 PM [place] Bing, MD [facility]  09/14/2021  2:30 PM Reva Bores, MD [facility]    Reva Bores, MD

## 2021-08-05 ENCOUNTER — Encounter: Payer: Self-pay | Admitting: Family Medicine

## 2021-08-07 ENCOUNTER — Encounter (HOSPITAL_COMMUNITY): Payer: Self-pay | Admitting: Obstetrics & Gynecology

## 2021-08-07 ENCOUNTER — Inpatient Hospital Stay (HOSPITAL_COMMUNITY)
Admission: AD | Admit: 2021-08-07 | Discharge: 2021-08-08 | Disposition: A | Discharge status: Home / Self Care | Payer: Medicaid Other | Attending: Obstetrics & Gynecology | Admitting: Obstetrics & Gynecology

## 2021-08-07 DIAGNOSIS — O09293 Supervision of pregnancy with other poor reproductive or obstetric history, third trimester: Secondary | ICD-10-CM | POA: Insufficient documentation

## 2021-08-07 DIAGNOSIS — Z3A31 31 weeks gestation of pregnancy: Secondary | ICD-10-CM | POA: Diagnosis not present

## 2021-08-07 DIAGNOSIS — O23593 Infection of other part of genital tract in pregnancy, third trimester: Secondary | ICD-10-CM | POA: Insufficient documentation

## 2021-08-07 DIAGNOSIS — B9689 Other specified bacterial agents as the cause of diseases classified elsewhere: Secondary | ICD-10-CM

## 2021-08-07 DIAGNOSIS — R102 Pelvic and perineal pain: Secondary | ICD-10-CM | POA: Insufficient documentation

## 2021-08-07 DIAGNOSIS — O26893 Other specified pregnancy related conditions, third trimester: Secondary | ICD-10-CM | POA: Insufficient documentation

## 2021-08-07 DIAGNOSIS — Z3689 Encounter for other specified antenatal screening: Secondary | ICD-10-CM

## 2021-08-07 LAB — URINALYSIS, ROUTINE W REFLEX MICROSCOPIC
Bilirubin Urine: NEGATIVE
Glucose, UA: NEGATIVE mg/dL
Hgb urine dipstick: NEGATIVE
Ketones, ur: NEGATIVE mg/dL
Leukocytes,Ua: NEGATIVE
Nitrite: NEGATIVE
Protein, ur: 30 mg/dL — AB
Specific Gravity, Urine: 1.021 (ref 1.005–1.030)
pH: 5 (ref 5.0–8.0)

## 2021-08-07 LAB — WET PREP, GENITAL
Sperm: NONE SEEN
Trich, Wet Prep: NONE SEEN
WBC, Wet Prep HPF POC: >=10 — AB (ref <10)
Yeast Wet Prep HPF POC: NONE SEEN

## 2021-08-07 NOTE — MAU Note (Signed)
..  Courtney Burnett is a 31 y.o. at [redacted]w[redacted]d here in MAU reporting: constant vaginal pain that is sharp and started on Friday.  Denies vaginal bleeding or leaking of fluid. +FM, just not as much as she normally does.  Onset of complaint: Friday Pain score: 7/10 Vitals:   08/07/21 2256  BP: (!) 145/75  Pulse: 88  Resp: 17  Temp: 98.7 F (37.1 C)  SpO2: 100%     Is taking procardia for blood pressure and took it around 9am.  FHT:135 Lab orders placed from triage:

## 2021-08-08 ENCOUNTER — Encounter: Payer: Self-pay | Admitting: Family Medicine

## 2021-08-08 DIAGNOSIS — R102 Pelvic and perineal pain: Secondary | ICD-10-CM

## 2021-08-08 DIAGNOSIS — Z3A31 31 weeks gestation of pregnancy: Secondary | ICD-10-CM | POA: Diagnosis not present

## 2021-08-08 LAB — GC/CHLAMYDIA PROBE AMP (~~LOC~~) NOT AT ARMC
Chlamydia: NEGATIVE
Comment: NEGATIVE
Comment: NORMAL
Neisseria Gonorrhea: NEGATIVE

## 2021-08-08 MED ORDER — METRONIDAZOLE 0.75 % VA GEL: 1.0000 | Freq: Every day | VAGINAL | 0 refills | Status: DC

## 2021-08-09 ENCOUNTER — Other Ambulatory Visit: Payer: Self-pay | Admitting: *Deleted

## 2021-08-09 DIAGNOSIS — K219 Gastro-esophageal reflux disease without esophagitis: Secondary | ICD-10-CM

## 2021-08-09 MED ORDER — OMEPRAZOLE 20 MG PO CPDR: 20.0000 mg | DELAYED_RELEASE_CAPSULE | Freq: Every day | ORAL | 3 refills | Status: AC

## 2021-08-10 ENCOUNTER — Ambulatory Visit: Payer: Medicaid Other | Attending: Obstetrics and Gynecology

## 2021-08-10 ENCOUNTER — Ambulatory Visit: Payer: Medicaid Other | Admitting: *Deleted

## 2021-08-10 VITALS — BP 135/83 | HR 101

## 2021-08-10 DIAGNOSIS — O99213 Obesity complicating pregnancy, third trimester: Secondary | ICD-10-CM

## 2021-08-10 DIAGNOSIS — O34219 Maternal care for unspecified type scar from previous cesarean delivery: Secondary | ICD-10-CM | POA: Insufficient documentation

## 2021-08-10 DIAGNOSIS — O365990 Maternal care for other known or suspected poor fetal growth, unspecified trimester, not applicable or unspecified: Secondary | ICD-10-CM | POA: Insufficient documentation

## 2021-08-10 DIAGNOSIS — E669 Obesity, unspecified: Secondary | ICD-10-CM

## 2021-08-10 DIAGNOSIS — O0990 Supervision of high risk pregnancy, unspecified, unspecified trimester: Secondary | ICD-10-CM

## 2021-08-10 DIAGNOSIS — O09213 Supervision of pregnancy with history of pre-term labor, third trimester: Secondary | ICD-10-CM

## 2021-08-10 DIAGNOSIS — O24013 Pre-existing diabetes mellitus, type 1, in pregnancy, third trimester: Secondary | ICD-10-CM | POA: Diagnosis present

## 2021-08-10 DIAGNOSIS — O10013 Pre-existing essential hypertension complicating pregnancy, third trimester: Secondary | ICD-10-CM | POA: Diagnosis not present

## 2021-08-10 DIAGNOSIS — O24113 Pre-existing diabetes mellitus, type 2, in pregnancy, third trimester: Secondary | ICD-10-CM | POA: Insufficient documentation

## 2021-08-10 DIAGNOSIS — E119 Type 2 diabetes mellitus without complications: Secondary | ICD-10-CM

## 2021-08-10 DIAGNOSIS — O09293 Supervision of pregnancy with other poor reproductive or obstetric history, third trimester: Secondary | ICD-10-CM | POA: Diagnosis not present

## 2021-08-10 DIAGNOSIS — O10913 Unspecified pre-existing hypertension complicating pregnancy, third trimester: Secondary | ICD-10-CM | POA: Diagnosis not present

## 2021-08-10 DIAGNOSIS — Z3A31 31 weeks gestation of pregnancy: Secondary | ICD-10-CM | POA: Diagnosis not present

## 2021-08-10 DIAGNOSIS — O34218 Maternal care for other type scar from previous cesarean delivery: Secondary | ICD-10-CM | POA: Diagnosis not present

## 2021-08-10 DIAGNOSIS — O365930 Maternal care for other known or suspected poor fetal growth, third trimester, not applicable or unspecified: Secondary | ICD-10-CM | POA: Diagnosis not present

## 2021-08-11 ENCOUNTER — Other Ambulatory Visit: Payer: Self-pay | Admitting: *Deleted

## 2021-08-11 DIAGNOSIS — O24113 Pre-existing diabetes mellitus, type 2, in pregnancy, third trimester: Secondary | ICD-10-CM

## 2021-08-11 DIAGNOSIS — O10913 Unspecified pre-existing hypertension complicating pregnancy, third trimester: Secondary | ICD-10-CM

## 2021-08-11 DIAGNOSIS — O34219 Maternal care for unspecified type scar from previous cesarean delivery: Secondary | ICD-10-CM

## 2021-08-11 DIAGNOSIS — O09299 Supervision of pregnancy with other poor reproductive or obstetric history, unspecified trimester: Secondary | ICD-10-CM

## 2021-08-11 DIAGNOSIS — R638 Other symptoms and signs concerning food and fluid intake: Secondary | ICD-10-CM

## 2021-08-17 ENCOUNTER — Ambulatory Visit (INDEPENDENT_AMBULATORY_CARE_PROVIDER_SITE_OTHER): Payer: Medicaid Other | Admitting: Family Medicine

## 2021-08-17 ENCOUNTER — Other Ambulatory Visit: Payer: Medicaid Other

## 2021-08-17 VITALS — BP 138/85 | HR 96 | Wt 226.0 lb

## 2021-08-17 DIAGNOSIS — Z3A32 32 weeks gestation of pregnancy: Secondary | ICD-10-CM

## 2021-08-17 DIAGNOSIS — O0990 Supervision of high risk pregnancy, unspecified, unspecified trimester: Secondary | ICD-10-CM

## 2021-08-17 DIAGNOSIS — O24113 Pre-existing diabetes mellitus, type 2, in pregnancy, third trimester: Secondary | ICD-10-CM

## 2021-08-17 DIAGNOSIS — O10919 Unspecified pre-existing hypertension complicating pregnancy, unspecified trimester: Secondary | ICD-10-CM | POA: Diagnosis not present

## 2021-08-17 NOTE — Progress Notes (Signed)
   PRENATAL VISIT NOTE  Subjective:  Courtney Burnett is a 31 y.o. G2P0102 at [redacted]w[redacted]d being seen today for ongoing prenatal care.  She is currently monitored for the following issues for this low-risk pregnancy and has Flank pain; Chronic hypertension affecting pregnancy; Supervision of high risk pregnancy, antepartum; Anxiety; Obesity affecting pregnancy; History of cesarean section, classical; Type 2 diabetes mellitus in pregnancy; History of preterm delivery; and Fetal growth restriction antepartum on their problem list.  Patient reports no complaints.  Contractions: Not present. Vag. Bleeding: None.  Movement: Present. Denies leaking of fluid.   The following portions of the patient's history were reviewed and updated as appropriate: allergies, current medications, past family history, past medical history, past social history, past surgical history and problem list.   Objective:   Vitals:   08/17/21 1521  BP: 138/85  Pulse: 96  Weight: 226 lb (102.5 kg)    Fetal Status: Fetal Heart Rate (bpm): 135   Movement: Present     General:  Alert, oriented and cooperative. Patient is in no acute distress.  Skin: Skin is warm and dry. No rash noted.   Cardiovascular: Normal heart rate noted  Respiratory: Normal respiratory effort, no problems with respiration noted  Abdomen: Soft, gravid, appropriate for gestational age.  Pain/Pressure: Absent     Pelvic: Cervical exam deferred        Extremities: Normal range of motion.  Edema: None  Mental Status: Normal mood and affect. Normal behavior. Normal judgment and thought content.   Assessment and Plan:  Pregnancy: G2P0102 at [redacted]w[redacted]d 1. Chronic hypertension affecting pregnancy Continue Nifedipine, bp is well controlled.  NST:  Baseline: 125 bpm, Variability: Good {> 6 bpm), Accelerations: Reactive, and Decelerations: Absent  - Fetal nonstress test - [place] MFM FETAL BPP WO NON STRESS; Future  2. Supervision of high risk pregnancy, antepartum   3.  Pregnancy with type 2 diabetes mellitus in third trimester On Metformin CBGs are good In testing. Last growth at 17% FBS 87, 92, 86, 95, 93, 94, 81, 84 2 hour pp 91, 91, 118, 103, 93, 84, 104, 98  - [place] MFM FETAL BPP WO NON STRESS; Future  Preterm labor symptoms and general obstetric precautions including but not limited to vaginal bleeding, contractions, leaking of fluid and fetal movement were reviewed in detail with the patient. Please refer to After Visit Summary for other counseling recommendations.   Return in 2 weeks (on 08/31/2021) for [facility], OB visit and BPP, BPP next week with Diane Day and then here weekly.  Future Appointments  Date Time Provider Department Center  08/24/2021  1:15 PM [facility] NST [facility]  08/31/2021  1:10 PM [facility] NST [facility]  08/31/2021  2:30 PM  Bing, MD [facility]  09/07/2021  2:30 PM [facility] NURSE [facility]  09/07/2021  2:45 PM [facility] US4 [facility]  09/14/2021  2:30 PM Reva Bores, MD [facility]    Reva Bores, MD

## 2021-08-24 ENCOUNTER — Ambulatory Visit: Payer: Medicaid Other | Admitting: *Deleted

## 2021-08-24 ENCOUNTER — Ambulatory Visit (INDEPENDENT_AMBULATORY_CARE_PROVIDER_SITE_OTHER): Payer: Medicaid Other

## 2021-08-24 VITALS — BP 126/69 | HR 92

## 2021-08-24 DIAGNOSIS — O10919 Unspecified pre-existing hypertension complicating pregnancy, unspecified trimester: Secondary | ICD-10-CM

## 2021-08-24 DIAGNOSIS — O24113 Pre-existing diabetes mellitus, type 2, in pregnancy, third trimester: Secondary | ICD-10-CM | POA: Diagnosis not present

## 2021-08-24 NOTE — Progress Notes (Signed)

## 2021-08-31 ENCOUNTER — Ambulatory Visit (INDEPENDENT_AMBULATORY_CARE_PROVIDER_SITE_OTHER): Payer: Medicaid Other | Admitting: *Deleted

## 2021-08-31 ENCOUNTER — Ambulatory Visit (INDEPENDENT_AMBULATORY_CARE_PROVIDER_SITE_OTHER): Payer: Medicaid Other

## 2021-08-31 ENCOUNTER — Ambulatory Visit (INDEPENDENT_AMBULATORY_CARE_PROVIDER_SITE_OTHER): Payer: Medicaid Other | Admitting: Obstetrics and Gynecology

## 2021-08-31 VITALS — BP 140/88 | HR 94 | Wt 228.0 lb

## 2021-08-31 DIAGNOSIS — O0990 Supervision of high risk pregnancy, unspecified, unspecified trimester: Secondary | ICD-10-CM

## 2021-08-31 DIAGNOSIS — O24113 Pre-existing diabetes mellitus, type 2, in pregnancy, third trimester: Secondary | ICD-10-CM

## 2021-08-31 DIAGNOSIS — Z98891 History of uterine scar from previous surgery: Secondary | ICD-10-CM

## 2021-08-31 DIAGNOSIS — O10919 Unspecified pre-existing hypertension complicating pregnancy, unspecified trimester: Secondary | ICD-10-CM

## 2021-08-31 DIAGNOSIS — Z3A34 34 weeks gestation of pregnancy: Secondary | ICD-10-CM

## 2021-08-31 DIAGNOSIS — O365990 Maternal care for other known or suspected poor fetal growth, unspecified trimester, not applicable or unspecified: Secondary | ICD-10-CM

## 2021-08-31 DIAGNOSIS — Z6839 Body mass index (BMI) 39.0-39.9, adult: Secondary | ICD-10-CM

## 2021-08-31 DIAGNOSIS — O99213 Obesity complicating pregnancy, third trimester: Secondary | ICD-10-CM

## 2021-08-31 NOTE — Progress Notes (Signed)
    PRENATAL VISIT NOTE  Subjective:  Courtney Burnett is a 31 y.o. G2P0102 at [redacted]w[redacted]d being seen today for ongoing prenatal care.  She is currently monitored for the following issues for this high-risk pregnancy and has Flank pain; Chronic hypertension affecting pregnancy; Supervision of high risk pregnancy, antepartum; Anxiety; Obesity affecting pregnancy; History of cesarean section, classical; Type 2 diabetes mellitus in pregnancy; History of preterm delivery; and Fetal growth restriction antepartum on their problem list.  Patient reports no complaints.  Contractions: Irritability. Vag. Bleeding: None.  Movement: Present. Denies leaking of fluid.   The following portions of the patient's history were reviewed and updated as appropriate: allergies, current medications, past family history, past medical history, past social history, past surgical history and problem list.   Objective:   Vitals:   08/31/21 1350  BP: 140/88  Pulse: 94  Weight: 228 lb (103.4 kg)    Fetal Status: Fetal Heart Rate (bpm): NST   Movement: Present     General:  Alert, oriented and cooperative. Patient is in no acute distress.  Skin: Skin is warm and dry. No rash noted.   Cardiovascular: Normal heart rate noted  Respiratory: Normal respiratory effort, no problems with respiration noted  Abdomen: Soft, gravid, appropriate for gestational age.  Pain/Pressure: Absent     Pelvic: Cervical exam deferred        Extremities: Normal range of motion.  Edema: None  Mental Status: Normal mood and affect. Normal behavior. Normal judgment and thought content.   Assessment and Plan:  Pregnancy: G2P0102 at [redacted]w[redacted]d 1. Supervision of high risk pregnancy, antepartum Bpp 10/10 today Follow up repeat growth u/s next week BTL papers signed 6/7 GBS nv  2. Fetal growth restriction antepartum Resolved last u/s. F/u next week 6/28: efw 17%, ac 26%, normal UA dopplers  3. [redacted] weeks gestation of pregnancy  4. Pregnancy with type 2  diabetes mellitus in third trimester On metformin 750 with breakfast and 750 qhs. Pt gives normal CBG numbers S/p normal fetal echo 5. Obesity affecting pregnancy in third trimester  6. BMI 39.0-39.9,adult  7. Chronic hypertension affecting pregnancy Stable on procardia 60 qday  8. History of cesarean section, classical For rpt c/s and btl on 8/6  Preterm labor symptoms and general obstetric precautions including but not limited to vaginal bleeding, contractions, leaking of fluid and fetal movement were reviewed in detail with the patient. Please refer to After Visit Summary for other counseling recommendations.   No follow-ups on file.  Future Appointments  Date Time Provider Department Center  09/07/2021  2:30 PM [facility]URSE [facility]  09/07/2021  2:45 PM [facility] US4 [facility]  09/14/2021  1:10 PM [facility] NST [facility]  09/14/2021  2:30 PM Reva Bores, MD [facility]    [place] Bing, MD

## 2021-08-31 NOTE — Progress Notes (Cosign Needed)
Patient informed that the ultrasound is considered a limited obstetric ultrasound and is not intended to be a complete ultrasound exam.  Patient also informed that the ultrasound is not being completed with the intent of assessing for fetal or placental anomalies or any pelvic abnormalities. Explained that the purpose of today's ultrasound is to assess for fetal wellbeing.  Patient acknowledges the purpose of the exam and the limitations of the study.         

## 2021-09-01 ENCOUNTER — Encounter: Payer: Self-pay | Admitting: Obstetrics and Gynecology

## 2021-09-02 NOTE — Patient Instructions (Addendum)
CYNETHIA SCHINDLER  09/02/2021   Your procedure is scheduled on:  09/18/2021  Arrive at 0945 at Entrance C on [address] at [facility]  and [employer]. You are invited to use the FREE valet parking or use the Visitor's parking deck.  Pick up the phone at the desk and dial [phone].  Call this number if you have problems the morning of surgery: [phone]  Remember:   Do not eat food:(After Midnight) Desps de medianoche.  Do not drink clear liquids: (After Midnight) Desps de medianoche.  Take these medicines the morning of surgery with A SIP OF WATER:  Procardia zoloft and pantoprazole   Do not wear jewelry, make-up or nail polish.  Do not wear lotions, powders, or perfumes. Do not wear deodorant.  Do not shave 48 hours prior to surgery.  Do not bring valuables to the hospital.  [facility] is not   responsible for any belongings or valuables brought to the hospital.  Contacts, dentures or bridgework may not be worn into surgery.  Leave suitcase in the car. After surgery it may be brought to your room.  For patients admitted to the hospital, checkout time is 11:00 AM the day of              discharge.      Please read over the following fact sheets that you were given:     Preparing for Surgery

## 2021-09-05 ENCOUNTER — Encounter (HOSPITAL_COMMUNITY): Payer: Self-pay

## 2021-09-05 ENCOUNTER — Telehealth (HOSPITAL_COMMUNITY): Payer: Self-pay | Admitting: *Deleted

## 2021-09-05 NOTE — Telephone Encounter (Signed)
Preadmission screen  

## 2021-09-07 ENCOUNTER — Other Ambulatory Visit: Payer: Self-pay | Admitting: Family Medicine

## 2021-09-07 ENCOUNTER — Ambulatory Visit: Payer: Medicaid Other | Admitting: *Deleted

## 2021-09-07 ENCOUNTER — Ambulatory Visit: Payer: Medicaid Other | Attending: Maternal & Fetal Medicine

## 2021-09-07 VITALS — BP 132/87 | HR 96

## 2021-09-07 DIAGNOSIS — O10913 Unspecified pre-existing hypertension complicating pregnancy, third trimester: Secondary | ICD-10-CM | POA: Diagnosis not present

## 2021-09-07 DIAGNOSIS — O365930 Maternal care for other known or suspected poor fetal growth, third trimester, not applicable or unspecified: Secondary | ICD-10-CM

## 2021-09-07 DIAGNOSIS — O99213 Obesity complicating pregnancy, third trimester: Secondary | ICD-10-CM | POA: Insufficient documentation

## 2021-09-07 DIAGNOSIS — O09213 Supervision of pregnancy with history of pre-term labor, third trimester: Secondary | ICD-10-CM | POA: Diagnosis not present

## 2021-09-07 DIAGNOSIS — O10919 Unspecified pre-existing hypertension complicating pregnancy, unspecified trimester: Secondary | ICD-10-CM

## 2021-09-07 DIAGNOSIS — Z3A35 35 weeks gestation of pregnancy: Secondary | ICD-10-CM

## 2021-09-07 DIAGNOSIS — O24113 Pre-existing diabetes mellitus, type 2, in pregnancy, third trimester: Secondary | ICD-10-CM

## 2021-09-07 DIAGNOSIS — O34219 Maternal care for unspecified type scar from previous cesarean delivery: Secondary | ICD-10-CM | POA: Diagnosis not present

## 2021-09-07 DIAGNOSIS — O365990 Maternal care for other known or suspected poor fetal growth, unspecified trimester, not applicable or unspecified: Secondary | ICD-10-CM | POA: Diagnosis present

## 2021-09-07 DIAGNOSIS — O09299 Supervision of pregnancy with other poor reproductive or obstetric history, unspecified trimester: Secondary | ICD-10-CM

## 2021-09-07 DIAGNOSIS — O09293 Supervision of pregnancy with other poor reproductive or obstetric history, third trimester: Secondary | ICD-10-CM

## 2021-09-07 DIAGNOSIS — E119 Type 2 diabetes mellitus without complications: Secondary | ICD-10-CM | POA: Diagnosis not present

## 2021-09-07 DIAGNOSIS — O0990 Supervision of high risk pregnancy, unspecified, unspecified trimester: Secondary | ICD-10-CM

## 2021-09-07 DIAGNOSIS — Z7984 Long term (current) use of oral hypoglycemic drugs: Secondary | ICD-10-CM

## 2021-09-07 DIAGNOSIS — O10013 Pre-existing essential hypertension complicating pregnancy, third trimester: Secondary | ICD-10-CM

## 2021-09-07 DIAGNOSIS — R638 Other symptoms and signs concerning food and fluid intake: Secondary | ICD-10-CM

## 2021-09-08 ENCOUNTER — Other Ambulatory Visit: Payer: Self-pay | Admitting: *Deleted

## 2021-09-08 ENCOUNTER — Encounter (HOSPITAL_COMMUNITY): Payer: Self-pay

## 2021-09-08 ENCOUNTER — Telehealth: Payer: Self-pay | Admitting: *Deleted

## 2021-09-08 DIAGNOSIS — O10919 Unspecified pre-existing hypertension complicating pregnancy, unspecified trimester: Secondary | ICD-10-CM

## 2021-09-08 DIAGNOSIS — O365930 Maternal care for other known or suspected poor fetal growth, third trimester, not applicable or unspecified: Secondary | ICD-10-CM

## 2021-09-08 NOTE — Telephone Encounter (Signed)
-----   Message from Alvin Critchley sent at 09/08/2021  1:22 PM EDT ----- Regarding: Cancel Stress Test Pt called in stating she was advised by physicians at [facility] to cancel stress test for here due to her test on Monday. She is unsure if they are referring to the one here. Please call patient and explain, she does not understand what to do.

## 2021-09-08 NOTE — Telephone Encounter (Signed)
Left message for pt to call back to explain appts

## 2021-09-12 ENCOUNTER — Ambulatory Visit: Payer: Medicaid Other | Attending: Obstetrics

## 2021-09-12 ENCOUNTER — Encounter: Payer: Self-pay | Admitting: *Deleted

## 2021-09-12 ENCOUNTER — Ambulatory Visit: Payer: Medicaid Other | Admitting: *Deleted

## 2021-09-12 VITALS — BP 136/84 | HR 90

## 2021-09-12 DIAGNOSIS — O0990 Supervision of high risk pregnancy, unspecified, unspecified trimester: Secondary | ICD-10-CM

## 2021-09-12 DIAGNOSIS — O24113 Pre-existing diabetes mellitus, type 2, in pregnancy, third trimester: Secondary | ICD-10-CM

## 2021-09-12 DIAGNOSIS — O10919 Unspecified pre-existing hypertension complicating pregnancy, unspecified trimester: Secondary | ICD-10-CM | POA: Insufficient documentation

## 2021-09-12 DIAGNOSIS — O99213 Obesity complicating pregnancy, third trimester: Secondary | ICD-10-CM

## 2021-09-12 DIAGNOSIS — O365930 Maternal care for other known or suspected poor fetal growth, third trimester, not applicable or unspecified: Secondary | ICD-10-CM | POA: Insufficient documentation

## 2021-09-12 DIAGNOSIS — O365990 Maternal care for other known or suspected poor fetal growth, unspecified trimester, not applicable or unspecified: Secondary | ICD-10-CM

## 2021-09-14 ENCOUNTER — Ambulatory Visit (INDEPENDENT_AMBULATORY_CARE_PROVIDER_SITE_OTHER): Payer: Medicaid Other | Admitting: Family Medicine

## 2021-09-14 ENCOUNTER — Other Ambulatory Visit: Payer: Medicaid Other

## 2021-09-14 ENCOUNTER — Other Ambulatory Visit (HOSPITAL_COMMUNITY)
Admission: RE | Admit: 2021-09-14 | Discharge: 2021-09-14 | Disposition: A | Discharge status: Home / Self Care | Payer: Medicaid Other | Source: Ambulatory Visit | Attending: Family Medicine | Admitting: Family Medicine

## 2021-09-14 VITALS — BP 136/85 | HR 96 | Wt 231.0 lb

## 2021-09-14 DIAGNOSIS — O0990 Supervision of high risk pregnancy, unspecified, unspecified trimester: Secondary | ICD-10-CM | POA: Insufficient documentation

## 2021-09-14 DIAGNOSIS — R109 Unspecified abdominal pain: Secondary | ICD-10-CM

## 2021-09-14 DIAGNOSIS — Z98891 History of uterine scar from previous surgery: Secondary | ICD-10-CM

## 2021-09-14 DIAGNOSIS — O24113 Pre-existing diabetes mellitus, type 2, in pregnancy, third trimester: Secondary | ICD-10-CM

## 2021-09-14 DIAGNOSIS — O365990 Maternal care for other known or suspected poor fetal growth, unspecified trimester, not applicable or unspecified: Secondary | ICD-10-CM

## 2021-09-14 DIAGNOSIS — O10919 Unspecified pre-existing hypertension complicating pregnancy, unspecified trimester: Secondary | ICD-10-CM

## 2021-09-14 DIAGNOSIS — O10913 Unspecified pre-existing hypertension complicating pregnancy, third trimester: Secondary | ICD-10-CM

## 2021-09-14 DIAGNOSIS — Z3A36 36 weeks gestation of pregnancy: Secondary | ICD-10-CM

## 2021-09-14 DIAGNOSIS — O0993 Supervision of high risk pregnancy, unspecified, third trimester: Secondary | ICD-10-CM

## 2021-09-14 NOTE — Progress Notes (Signed)
ROB [redacted]w[redacted]d  CC: None   Pt declines cervix exam  Pt does not have blood sugar log today.

## 2021-09-14 NOTE — Progress Notes (Signed)
   PRENATAL VISIT NOTE  Subjective:  Courtney Burnett is a 31 y.o. G2P0102 at [redacted]w[redacted]d being seen today for ongoing prenatal care.  She is currently monitored for the following issues for this high-risk pregnancy and has Flank pain; Chronic hypertension affecting pregnancy; Supervision of high risk pregnancy, antepartum; Anxiety; Obesity affecting pregnancy; History of cesarean section, classical; Type 2 diabetes mellitus in pregnancy; History of preterm delivery; and Fetal growth restriction antepartum on their problem list.  Patient reports no complaints.  Contractions: Not present. Vag. Bleeding: None.  Movement: Present. Denies leaking of fluid.   The following portions of the patient's history were reviewed and updated as appropriate: allergies, current medications, past family history, past medical history, past social history, past surgical history and problem list.   Objective:   Vitals:   09/14/21 1448  BP: 136/85  Pulse: 96  Weight: 231 lb (104.8 kg)    Fetal Status: Fetal Heart Rate (bpm): 136   Movement: Present     General:  Alert, oriented and cooperative. Patient is in no acute distress.  Skin: Skin is warm and dry. No rash noted.   Cardiovascular: Normal heart rate noted  Respiratory: Normal respiratory effort, no problems with respiration noted  Abdomen: Soft, gravid, appropriate for gestational age.  Pain/Pressure: Present     Pelvic: Cervical exam performed in the presence of a chaperone        Extremities: Normal range of motion.  Edema: Trace  Mental Status: Normal mood and affect. Normal behavior. Normal judgment and thought content.   Assessment and Plan:  Pregnancy: G2P0102 at [redacted]w[redacted]d 1. Chronic hypertension affecting pregnancy BP is well controlled on Procardia On ASA Last growth at 9% with normal fluid and dopplers  2. Pregnancy with type 2 diabetes mellitus in third trimester Not checking sugars. On Metformin 750 mg bid I advised good glycemic control in days  prior to delivery  3. Flank pain   4. Supervision of high risk pregnancy, antepartum Cultures today - Cervicovaginal ancillary only - Strep Gp B NAA  5. History of cesarean section, classical For RCS at 36 6/7 weeks scheduled  6. Fetal growth restriction antepartum In testing with MFM  Preterm labor symptoms and general obstetric precautions including but not limited to vaginal bleeding, contractions, leaking of fluid and fetal movement were reviewed in detail with the patient. Please refer to After Visit Summary for other counseling recommendations.   Return in 1 week (on 09/21/2021).  Future Appointments  Date Time Provider Department Center  09/16/2021 11:00 AM [facility] PAT 1 [facility] None    Reva Bores, MD

## 2021-09-16 ENCOUNTER — Other Ambulatory Visit (HOSPITAL_COMMUNITY)
Admission: RE | Admit: 2021-09-16 | Discharge: 2021-09-16 | Disposition: A | Discharge status: Home / Self Care | Payer: Medicaid Other | Source: Ambulatory Visit | Attending: Obstetrics and Gynecology | Admitting: Obstetrics and Gynecology

## 2021-09-16 DIAGNOSIS — Z98891 History of uterine scar from previous surgery: Secondary | ICD-10-CM | POA: Diagnosis present

## 2021-09-16 DIAGNOSIS — Z01818 Encounter for other preprocedural examination: Secondary | ICD-10-CM | POA: Diagnosis not present

## 2021-09-16 LAB — COMPREHENSIVE METABOLIC PANEL
ALT: 13 U/L (ref 0–44)
AST: 17 U/L (ref 15–41)
Albumin: 2.8 g/dL — ABNORMAL LOW (ref 3.5–5.0)
Alkaline Phosphatase: 115 U/L (ref 38–126)
Anion gap: 8 (ref 5–15)
BUN: 7 mg/dL (ref 6–20)
CO2: 18 mmol/L — ABNORMAL LOW (ref 22–32)
Calcium: 8.9 mg/dL (ref 8.9–10.3)
Chloride: 108 mmol/L (ref 98–111)
Creatinine, Ser: 0.39 mg/dL — ABNORMAL LOW (ref 0.44–1.00)
GFR, Estimated: >60 mL/min (ref >60)
Glucose, Bld: 140 mg/dL — ABNORMAL HIGH (ref 70–99)
Potassium: 3.8 mmol/L (ref 3.5–5.1)
Sodium: 134 mmol/L — ABNORMAL LOW (ref 135–145)
Total Bilirubin: 0.5 mg/dL (ref 0.3–1.2)
Total Protein: 6.3 g/dL — ABNORMAL LOW (ref 6.5–8.1)

## 2021-09-16 LAB — TYPE AND SCREEN
ABO/RH(D): O POS
Antibody Screen: NEGATIVE

## 2021-09-16 LAB — CBC
HCT: 39.5 % (ref 36.0–46.0)
Hemoglobin: 13.3 g/dL (ref 12.0–15.0)
MCH: 29.2 pg (ref 26.0–34.0)
MCHC: 33.7 g/dL (ref 30.0–36.0)
MCV: 86.8 fL (ref 80.0–100.0)
Platelets: 321 10*3/uL (ref 150–400)
RBC: 4.55 MIL/uL (ref 3.87–5.11)
RDW: 14.4 % (ref 11.5–15.5)
WBC: 9.3 10*3/uL (ref 4.0–10.5)
nRBC: 0 % (ref 0.0–0.2)

## 2021-09-16 LAB — STREP GP B NAA: Strep Gp B NAA: NEGATIVE

## 2021-09-16 LAB — CERVICOVAGINAL ANCILLARY ONLY
Chlamydia: NEGATIVE
Comment: NEGATIVE
Comment: NORMAL
Neisseria Gonorrhea: NEGATIVE

## 2021-09-17 LAB — RPR: RPR Ser Ql: NONREACTIVE

## 2021-09-18 ENCOUNTER — Encounter (HOSPITAL_COMMUNITY): Payer: Self-pay | Admitting: Obstetrics and Gynecology

## 2021-09-18 ENCOUNTER — Other Ambulatory Visit: Payer: Self-pay

## 2021-09-18 ENCOUNTER — Inpatient Hospital Stay (HOSPITAL_COMMUNITY)
Admission: RE | Admit: 2021-09-18 | Discharge: 2021-09-20 | DRG: 783 | Disposition: A | Discharge status: Home / Self Care | Payer: Medicaid Other | Attending: Obstetrics and Gynecology | Admitting: Obstetrics and Gynecology

## 2021-09-18 ENCOUNTER — Inpatient Hospital Stay (HOSPITAL_COMMUNITY): Payer: Medicaid Other | Admitting: Certified Registered Nurse Anesthetist

## 2021-09-18 ENCOUNTER — Encounter (HOSPITAL_COMMUNITY)
Admission: RE | Disposition: A | Discharge status: Home / Self Care | Payer: Self-pay | Source: Home / Self Care | Attending: Obstetrics and Gynecology

## 2021-09-18 DIAGNOSIS — O1002 Pre-existing essential hypertension complicating childbirth: Secondary | ICD-10-CM | POA: Diagnosis present

## 2021-09-18 DIAGNOSIS — O0990 Supervision of high risk pregnancy, unspecified, unspecified trimester: Secondary | ICD-10-CM

## 2021-09-18 DIAGNOSIS — O2412 Pre-existing diabetes mellitus, type 2, in childbirth: Secondary | ICD-10-CM | POA: Diagnosis not present

## 2021-09-18 DIAGNOSIS — O99334 Smoking (tobacco) complicating childbirth: Secondary | ICD-10-CM | POA: Diagnosis present

## 2021-09-18 DIAGNOSIS — O365930 Maternal care for other known or suspected poor fetal growth, third trimester, not applicable or unspecified: Secondary | ICD-10-CM | POA: Diagnosis present

## 2021-09-18 DIAGNOSIS — O24119 Pre-existing diabetes mellitus, type 2, in pregnancy, unspecified trimester: Secondary | ICD-10-CM | POA: Diagnosis present

## 2021-09-18 DIAGNOSIS — O99214 Obesity complicating childbirth: Secondary | ICD-10-CM | POA: Diagnosis not present

## 2021-09-18 DIAGNOSIS — O34211 Maternal care for low transverse scar from previous cesarean delivery: Principal | ICD-10-CM | POA: Diagnosis present

## 2021-09-18 DIAGNOSIS — Z3A36 36 weeks gestation of pregnancy: Secondary | ICD-10-CM | POA: Diagnosis not present

## 2021-09-18 DIAGNOSIS — Z7982 Long term (current) use of aspirin: Secondary | ICD-10-CM | POA: Diagnosis not present

## 2021-09-18 DIAGNOSIS — O99344 Other mental disorders complicating childbirth: Secondary | ICD-10-CM | POA: Diagnosis present

## 2021-09-18 DIAGNOSIS — O34219 Maternal care for unspecified type scar from previous cesarean delivery: Secondary | ICD-10-CM

## 2021-09-18 DIAGNOSIS — E119 Type 2 diabetes mellitus without complications: Secondary | ICD-10-CM | POA: Diagnosis present

## 2021-09-18 DIAGNOSIS — F419 Anxiety disorder, unspecified: Secondary | ICD-10-CM | POA: Diagnosis present

## 2021-09-18 DIAGNOSIS — F17210 Nicotine dependence, cigarettes, uncomplicated: Secondary | ICD-10-CM | POA: Diagnosis present

## 2021-09-18 DIAGNOSIS — Z98891 History of uterine scar from previous surgery: Secondary | ICD-10-CM | POA: Diagnosis not present

## 2021-09-18 DIAGNOSIS — Z302 Encounter for sterilization: Secondary | ICD-10-CM

## 2021-09-18 DIAGNOSIS — Z7984 Long term (current) use of oral hypoglycemic drugs: Secondary | ICD-10-CM

## 2021-09-18 DIAGNOSIS — O99210 Obesity complicating pregnancy, unspecified trimester: Secondary | ICD-10-CM | POA: Diagnosis present

## 2021-09-18 DIAGNOSIS — O10919 Unspecified pre-existing hypertension complicating pregnancy, unspecified trimester: Secondary | ICD-10-CM | POA: Diagnosis present

## 2021-09-18 DIAGNOSIS — O164 Unspecified maternal hypertension, complicating childbirth: Secondary | ICD-10-CM | POA: Diagnosis not present

## 2021-09-18 DIAGNOSIS — O9902 Anemia complicating childbirth: Secondary | ICD-10-CM | POA: Diagnosis present

## 2021-09-18 LAB — GLUCOSE, CAPILLARY
Glucose-Capillary: 104 mg/dL — ABNORMAL HIGH (ref 70–99)
Glucose-Capillary: 104 mg/dL — ABNORMAL HIGH (ref 70–99)

## 2021-09-18 LAB — ABO/RH: ABO/RH(D): O POS

## 2021-09-18 SURGERY — Surgical Case
Anesthesia: Spinal

## 2021-09-18 MED ORDER — OXYCODONE HCL 5 MG PO TABS
5.0000 mg | ORAL_TABLET | ORAL | Status: DC | PRN
  Administered 2021-09-18 – 2021-09-20 (×7): 10 mg via ORAL
  Filled 2021-09-18 (×7): qty 2

## 2021-09-18 MED ORDER — FENTANYL CITRATE (PF) 100 MCG/2ML IJ SOLN
INTRAMUSCULAR | Status: AC
  Filled 2021-09-18: qty 2

## 2021-09-18 MED ORDER — MORPHINE SULFATE (PF) 0.5 MG/ML IJ SOLN
INTRAMUSCULAR | Status: AC
  Filled 2021-09-18: qty 10

## 2021-09-18 MED ORDER — GABAPENTIN 300 MG PO CAPS
ORAL_CAPSULE | ORAL | Status: AC
  Filled 2021-09-18: qty 1

## 2021-09-18 MED ORDER — KETOROLAC TROMETHAMINE 30 MG/ML IJ SOLN
INTRAMUSCULAR | Status: AC
  Filled 2021-09-18: qty 1

## 2021-09-18 MED ORDER — BUPIVACAINE IN DEXTROSE 0.75-8.25 % IT SOLN
INTRATHECAL | Status: DC | PRN
  Administered 2021-09-18: 1.5 mL via INTRATHECAL

## 2021-09-18 MED ORDER — OXYTOCIN-SODIUM CHLORIDE 30-0.9 UT/500ML-% IV SOLN
INTRAVENOUS | Status: AC
  Filled 2021-09-18: qty 500

## 2021-09-18 MED ORDER — DEXAMETHASONE SODIUM PHOSPHATE 10 MG/ML IJ SOLN
INTRAMUSCULAR | Status: DC | PRN
  Administered 2021-09-18: 10 mg via INTRAVENOUS

## 2021-09-18 MED ORDER — SODIUM CHLORIDE 0.9 % IV SOLN
INTRAVENOUS | Status: AC
  Filled 2021-09-18: qty 2

## 2021-09-18 MED ORDER — LACTATED RINGERS IV SOLN: INTRAVENOUS | Status: DC

## 2021-09-18 MED ORDER — CEFAZOLIN SODIUM-DEXTROSE 2-4 GM/100ML-% IV SOLN
INTRAVENOUS | Status: AC
  Filled 2021-09-18: qty 100

## 2021-09-18 MED ORDER — ONDANSETRON HCL 4 MG/2ML IJ SOLN: 4.0000 mg | Freq: Three times a day (TID) | INTRAMUSCULAR | Status: DC | PRN

## 2021-09-18 MED ORDER — CEFOTETAN DISODIUM 2 G IJ SOLR
2.0000 g | INTRAMUSCULAR | Status: AC
Start: 2021-09-18 — End: 2021-09-18
  Administered 2021-09-18: 2 g via INTRAVENOUS

## 2021-09-18 MED ORDER — DIPHENHYDRAMINE HCL 50 MG/ML IJ SOLN: 12.5000 mg | INTRAMUSCULAR | Status: DC | PRN

## 2021-09-18 MED ORDER — TETANUS-DIPHTH-ACELL PERTUSSIS 5-2.5-18.5 LF-MCG/0.5 IM SUSY: 0.5000 mL | PREFILLED_SYRINGE | Freq: Once | INTRAMUSCULAR | Status: DC

## 2021-09-18 MED ORDER — PRENATAL MULTIVITAMIN CH
1.0000 | ORAL_TABLET | Freq: Every day | ORAL | Status: DC
Start: 2021-09-19 — End: 2021-09-20
  Administered 2021-09-19: 1 via ORAL
  Filled 2021-09-18: qty 1

## 2021-09-18 MED ORDER — SCOPOLAMINE 1 MG/3DAYS TD PT72
1.0000 | MEDICATED_PATCH | Freq: Once | TRANSDERMAL | Status: DC
  Filled 2021-09-18: qty 1

## 2021-09-18 MED ORDER — SIMETHICONE 80 MG PO CHEW: 80.0000 mg | CHEWABLE_TABLET | ORAL | Status: DC | PRN

## 2021-09-18 MED ORDER — GABAPENTIN 300 MG PO CAPS
300.0000 mg | ORAL_CAPSULE | ORAL | Status: AC
  Administered 2021-09-18: 300 mg via ORAL

## 2021-09-18 MED ORDER — DIBUCAINE (PERIANAL) 1 % EX OINT: 1.0000 | TOPICAL_OINTMENT | CUTANEOUS | Status: DC | PRN

## 2021-09-18 MED ORDER — ONDANSETRON HCL 4 MG/2ML IJ SOLN
INTRAMUSCULAR | Status: AC
  Filled 2021-09-18: qty 2

## 2021-09-18 MED ORDER — SOD CITRATE-CITRIC ACID 500-334 MG/5ML PO SOLN
ORAL | Status: AC
  Filled 2021-09-18: qty 30

## 2021-09-18 MED ORDER — ACETAMINOPHEN 500 MG PO TABS
ORAL_TABLET | ORAL | Status: AC
  Filled 2021-09-18: qty 2

## 2021-09-18 MED ORDER — PHENYLEPHRINE 80 MCG/ML (10ML) SYRINGE FOR IV PUSH (FOR BLOOD PRESSURE SUPPORT)
PREFILLED_SYRINGE | INTRAVENOUS | Status: DC | PRN
  Administered 2021-09-18 (×2): 80 ug via INTRAVENOUS

## 2021-09-18 MED ORDER — OXYTOCIN-SODIUM CHLORIDE 30-0.9 UT/500ML-% IV SOLN: INTRAVENOUS | Status: DC | PRN

## 2021-09-18 MED ORDER — MENTHOL 3 MG MT LOZG: 1.0000 | LOZENGE | OROMUCOSAL | Status: DC | PRN

## 2021-09-18 MED ORDER — HYDROMORPHONE HCL 1 MG/ML IJ SOLN
INTRAMUSCULAR | Status: AC
  Filled 2021-09-18: qty 0.5

## 2021-09-18 MED ORDER — FENTANYL CITRATE (PF) 100 MCG/2ML IJ SOLN
INTRAMUSCULAR | Status: DC | PRN
Start: 2021-09-18 — End: 2021-09-18
  Administered 2021-09-18: 15 ug via INTRATHECAL

## 2021-09-18 MED ORDER — SODIUM CHLORIDE 0.9 % IR SOLN
Status: DC | PRN
  Administered 2021-09-18: 1

## 2021-09-18 MED ORDER — ACETAMINOPHEN 500 MG PO TABS
1000.0000 mg | ORAL_TABLET | Freq: Four times a day (QID) | ORAL | Status: DC
  Administered 2021-09-18 – 2021-09-20 (×6): 1000 mg via ORAL
  Filled 2021-09-18 (×6): qty 2

## 2021-09-18 MED ORDER — NALOXONE HCL 4 MG/10ML IJ SOLN: 1.0000 ug/kg/h | INTRAVENOUS | Status: DC | PRN

## 2021-09-18 MED ORDER — MEPERIDINE HCL 25 MG/ML IJ SOLN: 6.2500 mg | INTRAMUSCULAR | Status: DC | PRN

## 2021-09-18 MED ORDER — ONDANSETRON HCL 4 MG/2ML IJ SOLN: 4.0000 mg | Freq: Once | INTRAMUSCULAR | Status: DC | PRN

## 2021-09-18 MED ORDER — HYDROMORPHONE HCL 1 MG/ML IJ SOLN
1.0000 mg | Freq: Once | INTRAMUSCULAR | Status: AC
  Administered 2021-09-18: 1 mg via INTRAVENOUS
  Filled 2021-09-18: qty 1

## 2021-09-18 MED ORDER — DIPHENHYDRAMINE HCL 25 MG PO CAPS: 25.0000 mg | ORAL_CAPSULE | ORAL | Status: DC | PRN

## 2021-09-18 MED ORDER — OXYCODONE HCL 5 MG PO TABS: 5.0000 mg | ORAL_TABLET | Freq: Four times a day (QID) | ORAL | Status: DC | PRN

## 2021-09-18 MED ORDER — NALOXONE HCL 0.4 MG/ML IJ SOLN: 0.4000 mg | INTRAMUSCULAR | Status: DC | PRN

## 2021-09-18 MED ORDER — ENOXAPARIN SODIUM 60 MG/0.6ML IJ SOSY
50.0000 mg | PREFILLED_SYRINGE | INTRAMUSCULAR | Status: DC
  Administered 2021-09-19 – 2021-09-20 (×2): 50 mg via SUBCUTANEOUS
  Filled 2021-09-18: qty 0.5
  Filled 2021-09-18: qty 0.6
  Filled 2021-09-18: qty 0.5

## 2021-09-18 MED ORDER — POVIDONE-IODINE 10 % EX SWAB
2.0000 | Freq: Once | CUTANEOUS | Status: AC
  Administered 2021-09-18: 2 via TOPICAL

## 2021-09-18 MED ORDER — WITCH HAZEL-GLYCERIN EX PADS: 1.0000 | MEDICATED_PAD | CUTANEOUS | Status: DC | PRN

## 2021-09-18 MED ORDER — ACETAMINOPHEN 500 MG PO TABS
1000.0000 mg | ORAL_TABLET | ORAL | Status: AC
Start: 2021-09-18 — End: 2021-09-18
  Administered 2021-09-18: 1000 mg via ORAL

## 2021-09-18 MED ORDER — SODIUM CHLORIDE 0.9% FLUSH: 3.0000 mL | INTRAVENOUS | Status: DC | PRN

## 2021-09-18 MED ORDER — STERILE WATER FOR IRRIGATION IR SOLN
Status: DC | PRN
  Administered 2021-09-18: 1

## 2021-09-18 MED ORDER — DIPHENHYDRAMINE HCL 25 MG PO CAPS: 25.0000 mg | ORAL_CAPSULE | Freq: Four times a day (QID) | ORAL | Status: DC | PRN

## 2021-09-18 MED ORDER — BUPIVACAINE HCL (PF) 0.5 % IJ SOLN
INTRAMUSCULAR | Status: AC
  Filled 2021-09-18: qty 30

## 2021-09-18 MED ORDER — OXYTOCIN-SODIUM CHLORIDE 30-0.9 UT/500ML-% IV SOLN
INTRAVENOUS | Status: DC | PRN
  Administered 2021-09-18: 300 mL via INTRAVENOUS

## 2021-09-18 MED ORDER — ONDANSETRON HCL 4 MG/2ML IJ SOLN
INTRAMUSCULAR | Status: DC | PRN
  Administered 2021-09-18: 4 mg via INTRAVENOUS

## 2021-09-18 MED ORDER — SOD CITRATE-CITRIC ACID 500-334 MG/5ML PO SOLN
30.0000 mL | ORAL | Status: AC
Start: 2021-09-18 — End: 2021-09-18
  Administered 2021-09-18: 30 mL via ORAL

## 2021-09-18 MED ORDER — SIMETHICONE 80 MG PO CHEW
80.0000 mg | CHEWABLE_TABLET | Freq: Three times a day (TID) | ORAL | Status: DC
  Administered 2021-09-19 – 2021-09-20 (×4): 80 mg via ORAL
  Filled 2021-09-18 (×4): qty 1

## 2021-09-18 MED ORDER — KETOROLAC TROMETHAMINE 30 MG/ML IJ SOLN
30.0000 mg | Freq: Four times a day (QID) | INTRAMUSCULAR | Status: AC | PRN
  Administered 2021-09-18 – 2021-09-19 (×3): 30 mg via INTRAVENOUS
  Filled 2021-09-18 (×3): qty 1

## 2021-09-18 MED ORDER — TRANEXAMIC ACID-NACL 1000-0.7 MG/100ML-% IV SOLN
INTRAVENOUS | Status: AC
  Filled 2021-09-18: qty 100

## 2021-09-18 MED ORDER — DEXAMETHASONE SODIUM PHOSPHATE 10 MG/ML IJ SOLN
INTRAMUSCULAR | Status: AC
  Filled 2021-09-18: qty 1

## 2021-09-18 MED ORDER — ZOLPIDEM TARTRATE 5 MG PO TABS: 5.0000 mg | ORAL_TABLET | Freq: Every evening | ORAL | Status: DC | PRN

## 2021-09-18 MED ORDER — KETOROLAC TROMETHAMINE 30 MG/ML IJ SOLN
30.0000 mg | Freq: Once | INTRAMUSCULAR | Status: AC | PRN
  Administered 2021-09-18: 30 mg via INTRAVENOUS

## 2021-09-18 MED ORDER — PHENYLEPHRINE HCL-NACL 20-0.9 MG/250ML-% IV SOLN
INTRAVENOUS | Status: AC
  Filled 2021-09-18: qty 250

## 2021-09-18 MED ORDER — MORPHINE SULFATE (PF) 0.5 MG/ML IJ SOLN
INTRAMUSCULAR | Status: DC | PRN
  Administered 2021-09-18: 150 ug via INTRATHECAL

## 2021-09-18 MED ORDER — KETOROLAC TROMETHAMINE 30 MG/ML IJ SOLN: 30.0000 mg | Freq: Four times a day (QID) | INTRAMUSCULAR | Status: AC | PRN

## 2021-09-18 MED ORDER — SENNOSIDES-DOCUSATE SODIUM 8.6-50 MG PO TABS
2.0000 | ORAL_TABLET | Freq: Every day | ORAL | Status: DC
Start: 2021-09-19 — End: 2021-09-20
  Administered 2021-09-19 – 2021-09-20 (×2): 2 via ORAL
  Filled 2021-09-18 (×2): qty 2

## 2021-09-18 MED ORDER — COCONUT OIL OIL: 1.0000 | TOPICAL_OIL | Status: DC | PRN

## 2021-09-18 MED ORDER — TRANEXAMIC ACID-NACL 1000-0.7 MG/100ML-% IV SOLN
1000.0000 mg | INTRAVENOUS | Status: AC
  Administered 2021-09-18: 1000 mg via INTRAVENOUS

## 2021-09-18 MED ORDER — OXYTOCIN-SODIUM CHLORIDE 30-0.9 UT/500ML-% IV SOLN
2.5000 [IU]/h | INTRAVENOUS | Status: AC
  Administered 2021-09-18: 2.5 [IU]/h via INTRAVENOUS
  Filled 2021-09-18: qty 500

## 2021-09-18 MED ORDER — HYDROMORPHONE HCL 1 MG/ML IJ SOLN
0.2500 mg | INTRAMUSCULAR | Status: DC | PRN
  Administered 2021-09-18 (×4): 0.5 mg via INTRAVENOUS

## 2021-09-18 MED ORDER — ACETAMINOPHEN 500 MG PO TABS: 1000.0000 mg | ORAL_TABLET | Freq: Four times a day (QID) | ORAL | Status: DC

## 2021-09-18 MED ORDER — PHENYLEPHRINE HCL-NACL 20-0.9 MG/250ML-% IV SOLN
INTRAVENOUS | Status: DC | PRN
  Administered 2021-09-18: 80 ug/min via INTRAVENOUS

## 2021-09-18 SURGICAL SUPPLY — 48 items
BENZOIN TINCTURE PRP APPL 2/3 (GAUZE/BANDAGES/DRESSINGS) ×2 IMPLANT
CANISTER PREVENA PLUS 150 (CANNISTER) ×1 IMPLANT
CHLORAPREP W/TINT 26ML (MISCELLANEOUS) ×4 IMPLANT
CLAMP CORD UMBIL (MISCELLANEOUS) ×2 IMPLANT
CLIP FILSHIE TUBAL LIGA STRL (Clip) ×2 IMPLANT
CLOTH BEACON ORANGE TIMEOUT ST (SAFETY) ×2 IMPLANT
DRESSING PREVENA PLUS CUSTOM (GAUZE/BANDAGES/DRESSINGS) IMPLANT
DRSG OPSITE POSTOP 4X10 (GAUZE/BANDAGES/DRESSINGS) ×2 IMPLANT
DRSG PREVENA PLUS CUSTOM (GAUZE/BANDAGES/DRESSINGS) ×2
ELECT REM PT RETURN 9FT ADLT (ELECTROSURGICAL) ×2
ELECTRODE REM PT RTRN 9FT ADLT (ELECTROSURGICAL) ×1 IMPLANT
EXTRACTOR VACUUM BELL STYLE (SUCTIONS) IMPLANT
GLOVE BIOGEL PI IND STRL 6.5 (GLOVE) ×1 IMPLANT
GLOVE BIOGEL PI IND STRL 7.0 (GLOVE) ×2 IMPLANT
GLOVE BIOGEL PI INDICATOR 6.5 (GLOVE) ×1
GLOVE BIOGEL PI INDICATOR 7.0 (GLOVE) ×2
GLOVE ORTHOPEDIC STR SZ6.5 (GLOVE) ×2 IMPLANT
GOWN STRL REUS W/TWL LRG LVL3 (GOWN DISPOSABLE) ×6 IMPLANT
HEMOSTAT ARISTA ABSORB 3G PWDR (HEMOSTASIS) ×1 IMPLANT
KIT ABG SYR 3ML LUER SLIP (SYRINGE) IMPLANT
MAT PREVALON FULL STRYKER (MISCELLANEOUS) ×1 IMPLANT
NDL HYPO 25X1 1.5 SAFETY (NEEDLE) IMPLANT
NEEDLE HYPO 22GX1.5 SAFETY (NEEDLE) ×2 IMPLANT
NEEDLE HYPO 25X1 1.5 SAFETY (NEEDLE) IMPLANT
NS IRRIG 1000ML POUR BTL (IV SOLUTION) ×2 IMPLANT
PACK C SECTION WH (CUSTOM PROCEDURE TRAY) ×2 IMPLANT
PAD OB MATERNITY 4.3X12.25 (PERSONAL CARE ITEMS) ×2 IMPLANT
RETAINER VISCERAL (MISCELLANEOUS) ×1 IMPLANT
RETRACTOR TRAXI PANNICULUS (MISCELLANEOUS) IMPLANT
RTRCTR C-SECT PINK 25CM LRG (MISCELLANEOUS) ×1 IMPLANT
STRIP CLOSURE SKIN 1/2X4 (GAUZE/BANDAGES/DRESSINGS) IMPLANT
SUT MON AB 4-0 PS1 27 (SUTURE) ×2 IMPLANT
SUT PDS AB 0 CTX 60 (SUTURE) ×1 IMPLANT
SUT PLAIN 0 NONE (SUTURE) IMPLANT
SUT PLAIN 2 0 (SUTURE) ×1
SUT PLAIN ABS 2-0 CT1 27XMFL (SUTURE) ×1 IMPLANT
SUT VIC AB 0 CT1 27 (SUTURE) ×1
SUT VIC AB 0 CT1 27XBRD ANBCTR (SUTURE) IMPLANT
SUT VIC AB 0 CT1 36 (SUTURE) ×4 IMPLANT
SUT VIC AB 0 CTX 36 (SUTURE) ×1
SUT VIC AB 0 CTX36XBRD ANBCTRL (SUTURE) ×1 IMPLANT
SUT VIC AB 2-0 CT1 27 (SUTURE) ×1
SUT VIC AB 2-0 CT1 TAPERPNT 27 (SUTURE) IMPLANT
SYR CONTROL 10ML LL (SYRINGE) ×2 IMPLANT
TOWEL OR 17X24 6PK STRL BLUE (TOWEL DISPOSABLE) ×2 IMPLANT
TRAXI PANNICULUS RETRACTOR (MISCELLANEOUS) ×1
TRAY FOLEY W/BAG SLVR 14FR LF (SET/KITS/TRAYS/PACK) ×2 IMPLANT
WATER STERILE IRR 1000ML POUR (IV SOLUTION) ×2 IMPLANT

## 2021-09-18 NOTE — Progress Notes (Signed)
OB/GYN Faculty Attending Note  Post Op Day 0  Subjective: Patient is feeling okay, states pain is not well controlled on IV pain meds.   Objective: Blood pressure 129/84, pulse 90, temperature 98 F (36.7 C), temperature source Oral, resp. rate 20, height 5\' 4"  (1.626 m), weight 104.8 kg, last menstrual period 01/03/2021, SpO2 95 %, unknown if currently breastfeeding. Temp:  [97.7 F (36.5 C)-99 F (37.2 C)] 98 F (36.7 C) (08/06 1900) Pulse Rate:  [84-97] 90 (08/06 1900) Resp:  [10-25] 20 (08/06 1900) BP: (121-153)/(72-90) 129/84 (08/06 1900) SpO2:  [93 %-98 %] 95 % (08/06 1900) Weight:  [104.8 kg] 104.8 kg (08/06 0955)  Physical Exam:  General: alert, oriented, cooperative Chest: normal respiratory effort  Abdomen:  soft, incision covered by dressing with no evidence of active bleeding   Recent Labs    09/16/21 1109  HGB 13.3  HCT 39.5    Assessment/Plan: Patient Active Problem List   Diagnosis Date Noted   S/P cesarean section 09/18/2021   Fetal growth restriction antepartum 06/09/2021   Type 2 diabetes mellitus in pregnancy 05/10/2021   History of preterm delivery 05/10/2021   Obesity affecting pregnancy 04/07/2021   Supervision of high risk pregnancy, antepartum 03/17/2021   Anxiety 03/17/2021   History of cesarean section, classical 05/12/2017   Chronic hypertension affecting pregnancy 05/01/2017   Flank pain 01/27/2017    Patient is 31 y.o. 26 POD#0 s/p RCS+BTL at [redacted]w[redacted]d for h/o classical and unwanted fertility. Up to review complicated c-section with patient. Reviewed significant adhesions, BTL done via Filshie clips. She is complaining of inadequately controlled pain on IV dilaudid, will start oxycodone now as patient is eating and see if that improves her pain. She is in couplet care with baby and baby is on CPAP on room air and doing well. .   Oxycodone now Will restart HTN meds prn Continue routine post partum care Pain meds prn Regular  diet Lovenox daily [place] S/p BTL for birth control    K. [redacted]w[redacted]d, MD, [facility] Attending Center for [facility] (Faculty Practice)  09/18/2021, 7:07 PM

## 2021-09-18 NOTE — Discharge Summary (Signed)
Postpartum Discharge Summary     Patient Name: Courtney Burnett DOB: 1990/07/08 MRN: [id number]  Date of admission: 09/18/2021 Delivery date:09/18/2021  Delivering provider: Sloan Leiter  Date of discharge: 09/20/2021  Admitting diagnosis: S/P cesarean section [Z98.891] Intrauterine pregnancy: [redacted]w[redacted]d    Secondary diagnosis:  Principal Problem:   S/P cesarean section Active Problems:   Chronic hypertension affecting pregnancy   Supervision of high risk pregnancy, antepartum   Obesity affecting pregnancy   History of cesarean section, classical   Type 2 diabetes mellitus in pregnancy  Additional problems: None     Discharge diagnosis: Preterm Pregnancy Delivered, CHTN, and Type 2 DM                                              Post partum procedures: None  Augmentation: N/A Complications: None  Hospital course: Sceduled C/S   31y.o. yo G2P0203 at [id number] admitted to the hospital 09/18/2021 for scheduled cesarean section with the following indication:Prior Uterine Surgery.Delivery details are as follows:  Membrane Rupture Time/Date: 12:40 PM ,09/18/2021   Delivery Method:C-Section, Vacuum Assisted  Details of operation can be found in separate operative note.  Patient had an uncomplicated postpartum course.  She is ambulating, tolerating a regular diet, passing flatus, and urinating well. Patient is discharged home in stable condition on  09/20/21        Newborn Data: Birth date:09/18/2021  Birth time:12:44 PM  Gender:Female  Living status:Living  Apgars:2 ,7  Weight:2330 g     Magnesium Sulfate received: No BMZ received: No Rhophylac:N/A MMR:No T-DaP:Given prenatally Flu: N/A Transfusion:No  Physical exam  Vitals:   09/19/21 0645 09/19/21 0719 09/19/21 1213 09/19/21 1924  BP: 122/76 119/72 125/72 133/79  Pulse: 85 78 95 88  Resp: '17 19 17 16  ' Temp: 98.4 F (36.9 C) 97.9 F (36.6 C) 98.3 F (36.8 C) 98.2 F (36.8 C)  TempSrc: Oral Oral Oral Oral  SpO2: 97% 99% 97%    Weight:      Height:       General: alert, cooperative, and no distress Lochia: appropriate Uterine Fundus: firm Incision: Dressing is clean, dry, and intact DVT Evaluation: No evidence of DVT seen on physical exam. Labs: Lab Results  Component Value Date   WBC 13.7 (H) 09/19/2021   HGB 11.5 (L) 09/19/2021   HCT 33.7 (L) 09/19/2021   MCV 87.1 09/19/2021   PLT 292 09/19/2021      Latest Ref Rng & Units 09/16/2021   11:09 AM  CMP  Glucose 70 - 99 mg/dL 140   BUN 6 - 20 mg/dL 7   Creatinine 0.44 - 1.00 mg/dL 0.39   Sodium 135 - 145 mmol/L 134   Potassium 3.5 - 5.1 mmol/L 3.8   Chloride 98 - 111 mmol/L 108   CO2 22 - 32 mmol/L 18   Calcium 8.9 - 10.3 mg/dL 8.9   Total Protein 6.5 - 8.1 g/dL 6.3   Total Bilirubin 0.3 - 1.2 mg/dL 0.5   Alkaline Phos 38 - 126 U/L 115   AST 15 - 41 U/L 17   ALT 0 - 44 U/L 13    Edinburgh Score:    09/19/2021   10:11 AM  Edinburgh Postnatal Depression Scale Screening Tool  I have been able to laugh and see the funny side of things. 0  I have looked forward with enjoyment to things. 1  I have blamed myself unnecessarily when things went wrong. 2  I have been anxious or worried for no good reason. 2  I have felt scared or panicky for no good reason. 2  Things have been getting on top of me. 2  I have been so unhappy that I have had difficulty sleeping. 1  I have felt sad or miserable. 1  I have been so unhappy that I have been crying. 1  The thought of harming myself has occurred to me. 0  Edinburgh Postnatal Depression Scale Total 12     After visit meds:  Allergies as of 09/20/2021   No Known Allergies      Medication List     STOP taking these medications    aspirin EC 81 MG tablet       TAKE these medications    Accu-Chek Guide test strip Generic drug: glucose blood 1 each by Other route in the morning, at noon, in the evening, and at bedtime. Check blood glucose four times a day: fasting and 2 hours after breakfast,  lunch, and dinner   Accu-Chek Guide w/Device Kit 1 each by Does not apply route in the morning, at noon, in the evening, and at bedtime. Check blood sugars for fasting, and two hours after breakfast, lunch and dinner (4 checks daily)   Accu-Chek Softclix Lancets lancets 1 each by Other route 4 (four) times daily.   furosemide 20 MG tablet Commonly known as: LASIX Take 1 tablet (20 mg total) by mouth daily. Start taking on: September 21, 2021   metFORMIN 500 MG tablet Commonly known as: GLUCOPHAGE Take 1.5 tablets (750 mg total) by mouth 2 (two) times daily with a meal.   NIFEdipine 60 MG 24 hr tablet Commonly known as: Procardia XL Take 1 tablet (60 mg total) by mouth daily.   omeprazole 20 MG capsule Commonly known as: PRILOSEC Take 1 capsule (20 mg total) by mouth daily. What changed: when to take this   oxyCODONE 5 MG immediate release tablet Commonly known as: Oxy IR/ROXICODONE Take 1 tablet (5 mg total) by mouth every 6 (six) hours as needed for severe pain.   PrePLUS 27-1 MG Tabs Take 1 tablet by mouth daily.   sertraline 100 MG tablet Commonly known as: Zoloft Take 1.5 tablets (150 mg total) by mouth daily.         Discharge home in stable condition Infant Feeding: Bottle and Breast Infant Disposition:home with mother Discharge instruction: per After Visit Summary and Postpartum booklet. Activity: Advance as tolerated. Pelvic rest for 6 weeks.  Diet: routine diet Future Appointments: Future Appointments  Date Time Provider [place]  09/26/2021  1:50 PM [facility] NURSE [facility]  11/02/2021  8:15 AM [facility] LAB [facility]  11/02/2021  8:35 AM Aletha Halim, MD [facility]   Follow up Visit:  Message sent 09/18/21 by Gifford Shave   Please schedule this patient for a In person postpartum visit in 6 weeks with the following provider: Any provider. Additional Postpartum F/U:Incision check 1 week and BP check 1 week   High risk pregnancy complicated by: GDM and HTN Delivery mode:  C-Section, Vacuum Assisted  Anticipated Birth Control:  BTL done [facility]   09/20/2021 Concepcion Living, MD

## 2021-09-18 NOTE — Op Note (Signed)
Courtney Burnett PROCEDURE DATE: 09/18/2021  PREOPERATIVE DIAGNOSES: Intrauterine pregnancy at [redacted]w[redacted]d weeks gestation; previous uterine incision classical, unwanted fertility  POSTOPERATIVE DIAGNOSES: The same  PROCEDURE: Repeat Low Transverse Cesarean Section, bilateral tubal ligation via filshie clips  SURGEON:  Baldemar Lenis, MD  ASSISTANT:  Rayann Heman, MD Derrel Nip, MD  An experienced assistant was required given the standard of surgical care given the complexity of the case.  This assistant was needed for exposure, dissection, suctioning, retraction, instrument exchange, assisting with delivery with administration of fundal pressure, and for overall help during the procedure.  ANESTHESIOLOGY TEAM: Anesthesiologist: Leilani Able, MD CRNA: Elgie Congo, CRNA  INDICATIONS: Courtney Burnett is a 31 y.o. [phone] at [redacted]w[redacted]d here for cesarean section secondary to the indications listed under preoperative diagnoses; please see preoperative note for further details.  The risks of cesarean section were discussed with the patient including but were not limited to: bleeding which may require transfusion or reoperation; infection which may require antibiotics; injury to bowel, bladder, ureters or other surrounding organs; need for additional procedures including hysterectomy in the event of a life-threatening hemorrhage; placental abnormalities wth subsequent pregnancies, incisional problems, thromboembolic phenomenon and other postoperative/anesthesia complications.  She consents to blood transfusion in the event of an emergency. The patient verbalized understanding of the plan, giving informed written consent for the procedure.    FINDINGS:  Viable female infant in cephalic presentation.  Apgars 2, 7, 8. Weight: 2330 gms. Clear amniotic fluid.  Intact placenta, three vessel cord. Significant omental adhesions to anterior abdominal wall in the pelvis. Uterus with bladder adhesions on the lower  uterine segment and on right aspect, omental adhesions to uterus bilaterally. Uterus not freely mobile due to omental and sidewall adhesions. Fallopian tubes normal appearing barely visible due to adhesions of uterus to pelvic sidewalls and omentum bilaterally. Left ovary visualized, right ovary not visualized due to adhesions.  ANESTHESIA: Spinal INTRAVENOUS FLUIDS: 1000 ml   ESTIMATED BLOOD LOSS: 227 ml URINE OUTPUT:  100 ml SPECIMENS: Placenta sent to pathology COMPLICATIONS: None immediate Cord pH: 7.13  PROCEDURE IN DETAIL:  The patient preoperatively received intravenous antibiotics and had sequential compression devices applied to her lower extremities.  She was then taken to the operating room where spinal anesthesia was administered and was found to be adequate. She was then placed in a dorsal supine position with a leftward tilt, and a Traxi retraction device was placed to good effect on the pannus/abdomen. She was prepped and draped in a sterile manner.  A foley catheter was placed into her bladder with sterile technique and attached to constant gravity.  After a timeout was performed, a Pfannenstiel skin incision was made with scalpel over her preexisting scar and carried through to the underlying layer of fascia. The fascia was incised in the midline, and this incision was extended bilaterally using the Mayo scissors.  Omentum was visible as soon as the fascia was incised. Kocher clamps were applied to the superior aspect of the fascial incision and the underlying rectus muscles were dissected off bluntly.  A similar process was carried out on the inferior aspect of the fascial incision. At this point, omentum was spilling through the defect in the rectus muscle and was noted to be densely adhered to the anterior abdominal wall bilaterally in the pelvis. The rectus muscles were transected bilaterally with cautery to facilitate access to the uterus. At this point, I called in Dr. Donavan Foil for  additional help. Several omental adhesions were  taken down with cautery to facilitate access to the uterus, taking care to ensure no bleeding and no bowel in close proximity. The rectus muscles were stretched. At this point, the uterus was visualized by sweeping aside omentum and while omentum still significantly attached to anterior uterine wall, adhesions were thick enough that decision made to retract omentum for delivery rather than continue adhesiolysis.  The Alexis O-ring retractor was placed into the incision. The bladder blade was inserted. Attention was turned to the lower uterine segment where a low transverse hysterotomy was made with a scalpel and extended bilaterally bluntly.  Alis clamps used to rupture amniotic sac. Infant head deep in pelvis and slowly and gently worked to level of hysterotomy. The infant head was attempted to be delivered through the hysterotomy but was a very tight fit. The hysterotomy was again stretched but could not facilitate delivery of the fetal head. The Alexis retractor was removed to facilitate access to the uterus. I attempted to utilize bandage scissors to make T incision, however head was wedged tightly at the hysterotomy and there was not enough room to utilize bandage scissors. After several more attempts at delivery with gentle fundal traction, I called for the vacuum. The bell cup Kiwi was applied to the fetal head, suction increased to 450 mm Hg. The fetal head was delivered easily with one pull and the rest of the fetal body delivered easily. The fetus had very poor tone and the cord was immediately clamped and cut and infant handed to waiting pedi staff. A venous cord gas was taken. Uterine massage was then performed, and the placenta delivered intact with a three-vessel cord. The uterus was then cleared of clots and debris.  The hysterotomy was closed with 0 Vicryl in a running locked fashion.   At this point attention turned to the fallopian tubes for  bilateral tubal ligation. Given the extensive bilateral uterine/omental/pelvic sidewall adhesions, the uterus was freely mobile and while we could visualize the fallopian tube on the left to the fimbriae, there was not enough space to perform a salpingectomy. A Filshie clip was applied and the same procedure was performed on the right. One figure-of-eight 0 Vicryl was placed at the hysterotomy for some bleeding and the hysterotomy was irrigated. No additional bleeding noted. Arista placed over the hysterotomy.   The pelvis was cleared of all clot and debris. Hemostasis was again confirmed on all surfaces. The fascia was then closed using 0 PDS in a running fashion.  The subcutaneous layer was irrigated, then reapproximated with 2-0 plain gut.  The skin was closed with a 4-0 Monocryl subcuticular stitch. The patient tolerated the procedure well. Sponge, lap, instrument and needle counts were correct x 3.  She was taken to the recovery room in stable condition.    Baldemar Lenis, M.D. Attending Obstetrician & Gynecologist, [facility] for [employer], [facility]

## 2021-09-18 NOTE — Progress Notes (Signed)
Patient complaining of pain an 8/10, called Dr. Arby Barrette and order received. Called Dr. Earlene Plater regarding 2 consecutive BPs out of normal range.  No new orders received, Dr. Earlene Plater stated she would review after the delivery she was in.

## 2021-09-18 NOTE — Anesthesia Procedure Notes (Signed)
Spinal  Patient location during procedure: OR Start time: 09/18/2021 11:49 AM End time: 09/18/2021 11:53 AM Reason for block: surgical anesthesia Staffing Performed: anesthesiologist  Anesthesiologist: Leilani Able, MD Performed by: Leilani Able, MD Authorized by: Leilani Able, MD   Preanesthetic Checklist Completed: patient identified, IV checked, site marked, risks and benefits discussed, surgical consent, monitors and equipment checked, pre-op evaluation and timeout performed Spinal Block Patient position: sitting Prep: DuraPrep and site prepped and draped Patient monitoring: continuous pulse ox and blood pressure Approach: midline Location: L3-4 Injection technique: single-shot Needle Needle type: Pencan  Needle gauge: 24 G Needle length: 10 cm Needle insertion depth: 5 cm Assessment Sensory level: T4 Events: CSF return
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
## 2021-09-18 NOTE — Transfer of Care (Signed)
Immediate Anesthesia Transfer of Care Note  Patient: Courtney Burnett  Procedure(s) Performed: CESAREAN SECTION WITH BILATERAL TUBAL LIGATION  Patient Location: PACU  Anesthesia Type:Spinal  Level of Consciousness: awake, alert  and oriented  Airway & Oxygen Therapy: Patient Spontanous Breathing  Post-op Assessment: Report given to RN and Post -op Vital signs reviewed and stable  Post vital signs: Reviewed and stable  Last Vitals:  Vitals Value Taken Time  BP 151/83 09/18/21 1400  Temp    Pulse 78 09/18/21 1404  Resp 26 09/18/21 1404  SpO2 96 % 09/18/21 1404  Vitals shown include unvalidated device data.  Last Pain:  Vitals:   09/18/21 0955  TempSrc: Oral         Complications: No notable events documented.

## 2021-09-18 NOTE — Progress Notes (Signed)
Patient asked twice to be able to walk downstairs with her husband. Patient up to bathroom for pericare without difficulty. Patient stated that she was not dizzy. Patient allowed to walk with husband per request.

## 2021-09-18 NOTE — Progress Notes (Signed)
Patient returned to her room without difficulty.

## 2021-09-18 NOTE — Anesthesia Preprocedure Evaluation (Signed)
Anesthesia Evaluation  Patient identified by MRN, date of birth, ID band Patient awake    Reviewed: Allergy & Precautions, H&P , Patient's Chart, lab work & pertinent test results  Airway Mallampati: II  TM Distance: >3 FB Neck ROM: full    Dental no notable dental hx.    Pulmonary Current Smoker and Patient abstained from smoking.,    Pulmonary exam normal        Cardiovascular hypertension, Pt. on medications negative cardio ROS Normal cardiovascular exam     Neuro/Psych PSYCHIATRIC DISORDERS Anxiety negative neurological ROS     GI/Hepatic Neg liver ROS, GERD  Medicated and Controlled,  Endo/Other  diabetes, Type 2, Oral Hypoglycemic AgentsMorbid obesity  Renal/GU Renal disease  negative genitourinary   Musculoskeletal   Abdominal (+) + obese,   Peds  Hematology negative hematology ROS (+)   Anesthesia Other Findings   Reproductive/Obstetrics (+) Pregnancy                             Anesthesia Physical Anesthesia Plan  ASA: 3  Anesthesia Plan: Spinal   Post-op Pain Management: Dilaudid IV   Induction:   PONV Risk Score and Plan: 3 and Ondansetron, Scopolamine patch - Pre-op and Treatment may vary due to age or medical condition  Airway Management Planned: Natural Airway, Simple Face Mask and Nasal Cannula  Additional Equipment: None  Intra-op Plan:   Post-operative Plan:   Informed Consent: I have reviewed the patients History and Physical, chart, labs and discussed the procedure including the risks, benefits and alternatives for the proposed anesthesia with the patient or authorized representative who has indicated his/her understanding and acceptance.       Plan Discussed with: CRNA  Anesthesia Plan Comments:         Anesthesia Quick Evaluation

## 2021-09-18 NOTE — Lactation Note (Signed)
This note was copied from a baby's chart.  NICU Lactation Consultation Note  Patient Name: Girl Faryn Sieg [id number]'[id number] Date: 09/18/2021 Age:30 hours   Subjective Reason for consult: Initial assessment; NICU baby I assisted with initiation of pumping and provided ed. Pt pumped for 1 month with last infant. She denies hx breast surgery/trauma.  Objective Infant data: Mother's Current Feeding Choice: Breast Milk and Formula  Infant feeding assessment Scale for Readiness: 2    Maternal data: G2P0203  C-Section, Vacuum Assisted Significant Breast History:: normal breast development and symmetry  Does the patient have breastfeeding experience prior to this delivery?: Yes How long did the patient breastfeed?: pumped 1 mo  Pump: Personal (mom cozy)  Assessment Maternal:   Intervention/Plan Interventions: [employer] brochure; Education  Tools: Pump Pump Education: Setup, frequency, and cleaning; Milk Storage  Plan: Consult Status: NICU follow-up  NICU Follow-up type: New admission follow up; Verify absence of engorgement; Maternal D/C visit; Verify onset of copious milk  Pump q3h  Elder Negus 09/18/2021, 5:22 PM

## 2021-09-19 ENCOUNTER — Encounter (HOSPITAL_COMMUNITY): Payer: Self-pay | Admitting: Obstetrics and Gynecology

## 2021-09-19 LAB — CBC
HCT: 33.7 % — ABNORMAL LOW (ref 36.0–46.0)
Hemoglobin: 11.5 g/dL — ABNORMAL LOW (ref 12.0–15.0)
MCH: 29.7 pg (ref 26.0–34.0)
MCHC: 34.1 g/dL (ref 30.0–36.0)
MCV: 87.1 fL (ref 80.0–100.0)
Platelets: 292 10*3/uL (ref 150–400)
RBC: 3.87 MIL/uL (ref 3.87–5.11)
RDW: 14.6 % (ref 11.5–15.5)
WBC: 13.7 10*3/uL — ABNORMAL HIGH (ref 4.0–10.5)
nRBC: 0 % (ref 0.0–0.2)

## 2021-09-19 LAB — GLUCOSE, CAPILLARY: Glucose-Capillary: 92 mg/dL (ref 70–99)

## 2021-09-19 MED ORDER — FUROSEMIDE 20 MG PO TABS
20.0000 mg | ORAL_TABLET | Freq: Every day | ORAL | Status: DC
  Administered 2021-09-19 – 2021-09-20 (×2): 20 mg via ORAL
  Filled 2021-09-19 (×2): qty 1

## 2021-09-19 MED ORDER — NIFEDIPINE ER OSMOTIC RELEASE 60 MG PO TB24
60.0000 mg | ORAL_TABLET | Freq: Every day | ORAL | Status: DC
  Administered 2021-09-19 – 2021-09-20 (×2): 60 mg via ORAL
  Filled 2021-09-19 (×3): qty 1

## 2021-09-19 NOTE — Lactation Note (Signed)
This note was copied from a baby's chart.  NICU Lactation Consultation Note  Patient Name: Girl Zaryia Markel MLJQG'B Date: 09/19/2021 Age:31 hours   Subjective Reason for consult: Follow-up assessment; Breastfeeding assistance Infant cuing and rooting. Observed brief, effective breastfeeding. Mother continues to pump frequently.   Objective Infant data: Mother's Current Feeding Choice: Breast Milk and Formula  Infant feeding assessment Scale for Readiness: 2    Maternal data: G2P0203  C-Section, Vacuum Assisted Significant Breast History:: normal breast development and symmetry  Does the patient have breastfeeding experience prior to this delivery?: Yes How long did the patient breastfeed?: pumped 1 mo  Pumping frequency: q3h Pumped volume: 5 mL   Pump: Personal (mom cozy)  Assessment Infant: LATCH Score: 8  Infant is likely ready to begin IDF-2  Maternal: Milk volume: Normal   Intervention/Plan Interventions: Education; Breast feeding basics reviewed; Assisted with latch; Infant Driven Feeding Algorithm education  Tools: Pump Pump Education: Setup, frequency, and cleaning; Milk Storage  Plan: Consult Status: NICU follow-up  NICU Follow-up type: Assist with IDF-2 (Mother does not need to pre-pump before breastfeeding); Verify absence of engorgement; Verify onset of copious milk  Continue to pump q3h and bf with cues on infant feeding schedule  Elder Negus 09/19/2021, 10:10 AM

## 2021-09-19 NOTE — Progress Notes (Addendum)
POSTPARTUM PROGRESS NOTE  POD #2  Subjective:  Courtney Burnett is a 31 y.o. [phone] s/p rLTCS at [redacted]w[redacted]d.  She reports she doing well. No acute events overnight. She reports she is doing well. Pain is better controlled on her medication regimen. She denies any problems with ambulating, voiding or po intake. Denies nausea or vomiting. She has passed flatus. Pain is well controlled.  Lochia is minimal. She is rooming with baby in the NICU  Objective: Blood pressure 122/76, pulse 85, temperature 98.4 F (36.9 C), temperature source Oral, resp. rate 17, height 5\' 4"  (1.626 m), weight 104.8 kg, last menstrual period 01/03/2021, SpO2 97 %, unknown if currently breastfeeding.  Physical Exam:  General: alert, cooperative and no distress Chest: no respiratory distress Heart:regular rate, distal pulses intact Abdomen: soft, nontender,  Uterine Fundus: firm, appropriately tender DVT Evaluation: No calf swelling or tenderness Extremities: no edema Skin: warm, dry; incision clean/dry/intact w/ honeycomb dressing in place  Recent Labs    09/16/21 1109 09/19/21 0526  HGB 13.3 11.5*  HCT 39.5 33.7*    Assessment/Plan: Courtney Burnett is a 31 y.o. G2P0203 s/p rLTCS at [redacted]w[redacted]d for cHTN, history of classical c-section.  POD#1 - Doing welll; pain is well controlled. H/H appropriate  Routine postpartum care  OOB, ambulated  Lovenox for VTE prophylaxis cHTN: had some elevated blood pressures yesterday. Will restart her procardia at 60mg  daily and start furosemide 20mg  daily Contraception: s/p BTL Feeding: breast and bottle when baby is ready  Dispo: Plan for discharge tomorrow.   LOS: 1 day   [redacted]w[redacted]d MD MPH OB Fellow, [facility]

## 2021-09-20 ENCOUNTER — Ambulatory Visit: Payer: Self-pay

## 2021-09-20 LAB — SURGICAL PATHOLOGY

## 2021-09-20 MED ORDER — IBUPROFEN 800 MG PO TABS
800.0000 mg | ORAL_TABLET | Freq: Three times a day (TID) | ORAL | Status: DC
Start: 2021-09-20 — End: 2021-09-20
  Administered 2021-09-20: 800 mg via ORAL
  Filled 2021-09-20: qty 1

## 2021-09-20 MED ORDER — OXYCODONE HCL 5 MG PO TABS: 5.0000 mg | ORAL_TABLET | Freq: Four times a day (QID) | ORAL | 0 refills | Status: DC | PRN

## 2021-09-20 MED ORDER — FUROSEMIDE 20 MG PO TABS: 20.0000 mg | ORAL_TABLET | Freq: Every day | ORAL | 0 refills | Status: DC

## 2021-09-20 NOTE — Anesthesia Postprocedure Evaluation (Signed)
Anesthesia Post Note  Patient: Courtney Burnett  Procedure(s) Performed: CESAREAN SECTION WITH BILATERAL TUBAL LIGATION     Patient location during evaluation: PACU Anesthesia Type: Spinal Level of consciousness: awake Pain management: pain level controlled Vital Signs Assessment: post-procedure vital signs reviewed and stable Respiratory status: spontaneous breathing Cardiovascular status: blood pressure returned to baseline Postop Assessment: no headache, no backache, spinal receding, patient able to bend at knees and no apparent nausea or vomiting Anesthetic complications: no   No notable events documented.  Last Vitals:  Vitals:   09/19/21 1213 09/19/21 1924  BP: 125/72 133/79  Pulse: 95 88  Resp: 17 16  Temp: 36.8 C 36.8 C  SpO2: 97%     Last Pain:  Vitals:   09/20/21 0748  TempSrc:   PainSc: 6                  John F Scharlene Corn

## 2021-09-20 NOTE — Lactation Note (Signed)
This note was copied from a baby's chart.  NICU Lactation Consultation Note  Patient Name: Courtney Burnett BHALP'F Date: 09/20/2021 Age:31 hours  Subjective Reason for consult: Follow-up assessment; NICU baby; Infant < 6lbs; Early term 37-38.6wks; Maternal discharge  Visited with mom of 51 hours old ETI NICU female, she's a P3 and reports she's put baby to breast twice yesterday but noticed that pumping has been inconsistent. Explained to Ms. Labella the importance of consistent pumping for the onset of lactogenesis II and the prevention of engorgement, she voiced understanding. Offered assistance with latch but mom politely declined, she voiced she was very tired and will do a bottle for the 11 am feeding. Asked mom to call for assistance when needed. Reviewed feeding cues, pumping schedule, lactogenesis II, anticipatory guidelines and discharge education, Ms. Stanek is getting discharge today.  Objective Infant data: Mother's Current Feeding Choice: Breast Milk and Formula  Infant feeding assessment Scale for Readiness: 2 Scale for Quality: 2  Maternal data: G2P0203  C-Section, Vacuum Assisted Pumping frequency: 3-4 times/24 hours Pumped volume: 5 mL Pump: Personal (mom cozy)  Assessment Infant: LATCH Score: 10 (Per RN) Maternal: Milk volume: Normal  Intervention/Plan Interventions: Breast feeding basics reviewed; DEBP; Education Tools: Pump Pump Education: Setup, frequency, and cleaning; Milk Storage  Plan of care: Encouraged mom to try pumping consistently every 3 hours ideally 8 pumping sessions/24 hours. MOB will keep pump parts in baby's room (couple care) Hand expression, breast massage and coconut oil were also encouraged prior pumping MOB was also encouraged to take baby to breast on feeding cues around feeding times Family will continue working on bottle feedings  GOB present and supportive. All questions and concerns answered, family to contact [facility] services  PRN.  Consult Status: NICU follow-up  NICU Follow-up type: Verify onset of copious milk; Verify absence of engorgement; Assist with IDF-2 (Mother does not need to pre-pump before breastfeeding)   Rosezetta Balderston S Fleurette Woolbright 09/20/2021, 1:35 PM

## 2021-09-20 NOTE — Clinical Social Work Maternal (Signed)
CLINICAL SOCIAL WORK MATERNAL/CHILD NOTE  Patient Details  Name: Courtney Burnett MRN: [id number] Date of Birth: 10/08/1990  Date:  06-29-21  Clinical Social Worker Initiating Note:  Courtney Burnett Date/Time: Initiated:  09/19/21/1210     Child's Name:  Courtney Burnett   Biological Parents:  Mother, Father   Need for Interpreter:  None   Reason for Referral:  Behavioral Health Concerns   Address:  [place] [id number]    Phone number:  [phone] (home)     Additional phone number: [phone]   Household Members/Support Persons (HM/SP):   Household Member/Support Person 1, Household Member/Support Person 2, Household Member/Support Person 3   HM/SP Name Relationship DOB or Age  HM/SP -1 Courtney Burnett FOB 08/26/1993  HM/SP -2 Courtney Burnett daughter 05/12/17  HM/SP -3 Courtney Burnett daughter 05/12/17  HM/SP -4        HM/SP -5        HM/SP -6        HM/SP -7        HM/SP -8          Natural Supports (not living in the home):  Extended Family, Immediate Family, Spouse/significant other (MOB reported that FOB's family will also provide support if needed.)   Professional Supports: None   Employment: Part-time   Type of Work: [employer] [place]   Education:  9 to 11 years (MOB completed 10th grade)   Homebound arranged: No  Financial Resources:  [employer]   Other Resources:  Arts development officer Considerations Which May Impact Care:  none reported  Strengths:  Ability to meet basic needs  , Lexicographer chosen, Home prepared for child  , Understanding of illness, Psychotropic Medications, Compliance with medical plan     Psychotropic Medications:  Zoloft      Pediatrician:    Ambulance person List:   [place] Other ([place])  [facility]      Pediatrician Fax Number:    Risk Factors/Current Problems:  Mental Health Concerns     Cognitive  State:  Alert  , Goal Oriented  , Able to Concentrate  , Insightful  , Linear Thinking     Mood/Affect:  Happy  , Bright  , Interested  , Comfortable  , Relaxed     CSW Assessment: CSW met with MOB at infant's bedside in room 327 to complete an assessment for MH hx and Edinburgh Score of 12.  When CSW arrived, MOB was observing infant while she was asleep; everyone appeared happy and comfortable. CSW explained CSW's role and invited MOB to ask questions during the assessment. MOB was polite, easy to engaged, and receptive to meeting with CSW.   CSW asked about MOB's MH hx.  MOB acknowledged a hx of PMADs with her oldest children that resulted into a dx of depression. MOB shared that she MOB was open to PMAD education and supports. CSW provided education regarding the baby blues period vs. perinatal mood disorders, discussed treatment and gave resources for mental health follow up if concerns arise.  CSW recommends self-evaluation during the postpartum time period using the New Mom Checklist from Postpartum Progress and encouraged MOB to contact a medical professional if symptoms are noted at any time. MOB presented with insight and awareness and did not demonstrate any acute MH symptoms. CSW assessed for safety and MOB denied  SI, HI, and DV. MOB reported feeling comfortable seeking help if help is needed.   CSW provided review of Sudden Infant Death Syndrome (SIDS) precautions.    MOB reported feeling well informed by NICU team.  MOB also communicated having all essential items to care for infant post discharge   CSW will continue to offer resources and supports to family while infant remains in NICU.    CSW Plan/Description:  Psychosocial Support and Ongoing Assessment of Needs, Sudden Infant Death Syndrome (SIDS) Education, Perinatal Mood and Anxiety Disorder (PMADs) Education, Other Patient/Family Education, Other Information/Referral to [employer], MSW,  [employer] Clinical Social Work [phone]  Courtney Nanas, LCSW 09/20/2021, 12:15 PM

## 2021-09-20 NOTE — Progress Notes (Signed)
POSTPARTUM PROGRESS NOTE  POD #2  Subjective:  Courtney Burnett is a 31 y.o. [phone] s/p rLTCS at [redacted]w[redacted]d.  She reports she doing well. No acute events overnight. She reports pain is moderately controlled. She denies any problems with ambulating, voiding or po intake. Denies nausea or vomiting. She has passed flatus. Pain is moderately controlled.  Lochia is appropriate.  Objective: Blood pressure 133/79, pulse 88, temperature 98.2 F (36.8 C), temperature source Oral, resp. rate 16, height 5\' 4"  (1.626 m), weight 104.8 kg, last menstrual period 01/03/2021, SpO2 97 %, unknown if currently breastfeeding.  Physical Exam:  General: alert, cooperative and no distress Chest: no respiratory distress Heart:regular rate, distal pulses intact Abdomen: soft, nontender,  Uterine Fundus: firm, appropriately tender DVT Evaluation: No calf swelling or tenderness Extremities: trace edema Skin: warm, dry; incision clean/dry/intact w/ prevera dressing in place  Recent Labs    09/19/21 0526  HGB 11.5*  HCT 33.7*    Assessment/Plan: Courtney Burnett is a 31 y.o. G2P0203 s/p rLTCS at [redacted]w[redacted]d for cHTN and hx of xclassical CS.  POD#2 - Doing welll; pain poorly controlled. H/H appropriate  Routine postpartum care  OOB, ambulated  Lovenox for VTE prophylaxis Anemia: asymptomatic   Contraception: BTL Feeding: breast and formula  Dispo: Plan for discharge .   LOS: 2 days   [redacted]w[redacted]d, MD  09/20/2021, 8:17 AM

## 2021-09-21 ENCOUNTER — Telehealth (HOSPITAL_COMMUNITY): Payer: Self-pay | Admitting: *Deleted

## 2021-09-21 DIAGNOSIS — Z1331 Encounter for screening for depression: Secondary | ICD-10-CM

## 2021-09-21 NOTE — Telephone Encounter (Signed)
Inpatient EPDS=12. SW consult completed. Ambulatory IBH referral made today. Dr Alvester Morin notified via chart.   Duffy Rhody, RN 09-21-2021 at 9:28am

## 2021-09-22 ENCOUNTER — Encounter: Payer: Medicaid Other | Admitting: Advanced Practice Midwife

## 2021-09-22 ENCOUNTER — Ambulatory Visit: Payer: Self-pay

## 2021-09-22 NOTE — Lactation Note (Signed)
This note was copied from a baby's chart.  NICU Lactation Consultation Note  Patient Name: Courtney Burnett CBJSE'G Date: 09/22/2021 Age:31 years   Subjective Reason for consult: Follow-up assessment; NICU baby; Late-preterm 34-36.6wks  Lactation followed up with Ms. Arlana Pouch. She had just finished breastfeeding prior to entry and had pumped one breast while feeding on the other. She began pumping the alternate breast during our conversation.  Ms. Rybacki milk is coming to volume. She states that she is breastfeeding, and that she would like some assistance with positioning baby at the breast. I made an appointment for 1700 on 8/12.   I recommended that she continue to practice breastfeeding and continue to pump after each feeding. She has been pumping consistently. She denies concerns about her personal pump.  Objective Infant data: Mother's Current Feeding Choice: Breast Milk and Formula  Infant feeding assessment Scale for Readiness: 1 Scale for Quality: 2  Maternal data: G2P0203  C-Section, Vacuum Assisted Current breast feeding challenges:: NICU  Pumping frequency: q2-3 hours; sometimes sleeps through a session Pumped volume: 50 mL  Pump: Personal  Assessment Infant: LATCH Score: 9  Maternal: Milk volume: Normal  Intervention/Plan Interventions: Assisted with latch; Skin to skin; Adjust position  Tools: Pump Pump Education: Setup, frequency, and cleaning  Plan: Consult Status: NICU follow-up  NICU Follow-up type: Assist with IDF-1 (Mother to pre-pump before breastfeeding)    Walker Shadow 09/22/2021, 5:43 PM

## 2021-09-24 ENCOUNTER — Ambulatory Visit: Payer: Self-pay

## 2021-09-24 NOTE — Lactation Note (Signed)
This note was copied from a baby's chart. Lactation Consultation Note  Patient Name: Courtney Burnett WNIOE'[id number] Date: 09/24/2021 Reason for consult: Follow-up assessment;NICU baby;Infant < 6lbs;Early term 37-38.6wks;Breastfeeding assistance Age:31 days  Visited with mom of 17 40/90 weeks old (adjusted) NICU female, she's a P2 and requested to move her 5 pm feeding to 2 pm, baby might get discharged today, NICU RN needed to do the car seat test. Ms. Strieter wanted to try the cross cradle hold, she voiced she feels more comfortable with football so far, but wanted to try something different this time. LC took baby STS to mother's L breast in with and without NS # 20 (see LATCH score). Reviewed discharge education, patient lives in [place] and [place] said she'll F/U with the the [facility] office at [facility], she's aware of [facility] in the [place] area.  Feeding Mother's Current Feeding Choice: Breast Milk and Formula  LATCH Score Latch: Grasps breast easily, tongue down, lips flanged, rhythmical sucking. (with NS # 20; baby kept slipping off the breast without it)  Audible Swallowing: A few with stimulation (with breast compressions, pool of EBM on NS)  Type of Nipple: Everted at rest and after stimulation  Comfort (Breast/Nipple): Soft / non-tender  Hold (Positioning): Assistance needed to correctly position infant at breast and maintain latch. (minimal assistance needed)  LATCH Score: 8  Lactation Tools Discussed/Used Tools: Pump Breast pump type: Double-Electric Breast Pump Pump Education: Setup, frequency, and cleaning;Milk Storage Reason for Pumping: ETI in NICU Pumping frequency: 6 times/24 hours Pumped volume: 60 mL (60-75 ml)  Interventions Interventions: Assisted with latch;Breast massage;Hand express;Breast compression;Adjust position;Support pillows;DEBP;Education  Plan of care: Encouraged mom to try pumping consistently every 3 hours ideally 8 pumping sessions/24  hours. MOB will keep pump parts in baby's room (couple care) MOB was also encouraged to take baby to breast on feeding cues ad lib using NS # 20 PRN Family will continue working on bottle feedings   GOB present and supportive. All questions and concerns answered, family to contact [facility] services PRN.  Discharge Discharge Education: Outpatient recommendation  Consult Status Consult Status: Complete Date: 09/24/21 Follow-up type: Call as needed   Shatima Zalar Venetia Constable 09/24/2021, 3:45 PM

## 2021-09-26 ENCOUNTER — Ambulatory Visit (INDEPENDENT_AMBULATORY_CARE_PROVIDER_SITE_OTHER): Payer: Medicaid Other

## 2021-09-26 DIAGNOSIS — O10919 Unspecified pre-existing hypertension complicating pregnancy, unspecified trimester: Secondary | ICD-10-CM

## 2021-09-26 MED ORDER — NIFEDIPINE ER OSMOTIC RELEASE 60 MG PO TB24: 60.0000 mg | ORAL_TABLET | Freq: Every day | ORAL | 3 refills | Status: DC

## 2021-09-26 MED ORDER — FUROSEMIDE 20 MG PO TABS: 20.0000 mg | ORAL_TABLET | Freq: Every day | ORAL | 0 refills | Status: DC

## 2021-09-26 NOTE — Progress Notes (Addendum)
Subjective:  Courtney Burnett is a 31 y.o. female here for BP check and incision check.  Hypertension ROS: taking medications as instructed, no medication side effects noted, no TIA's, no chest pain on exertion, no dyspnea on exertion, and pt c/o HA's and swelling in feet  .   Pt reports incision healing well  Objective:  BP (!) 149/94   Pulse (!) 101   Breastfeeding Yes   Appearance alert, well appearing, and in no distress. Incision healing well   Assessment:   Blood Pressure today in office needs improvement.  Incision healed nicely, no signs of infection, scar looks good Wound vac removed  Pt c/o cough advised to take COVID test pt agreeable.   Plan:  B/P increased and Lasix Rx sent for swelling  .

## 2021-09-27 ENCOUNTER — Other Ambulatory Visit: Payer: Self-pay | Admitting: *Deleted

## 2021-09-27 DIAGNOSIS — O10919 Unspecified pre-existing hypertension complicating pregnancy, unspecified trimester: Secondary | ICD-10-CM

## 2021-09-27 MED ORDER — NIFEDIPINE ER OSMOTIC RELEASE 60 MG PO TB24: 60.0000 mg | ORAL_TABLET | Freq: Two times a day (BID) | ORAL | 3 refills | Status: DC

## 2021-09-27 NOTE — BH Specialist Note (Signed)
Integrated Behavioral Health via Telemedicine Visit  10/10/2021 [name] [id number]  Number of Integrated Behavioral Health Clinician visits: 1- Initial Visit  Session Start time: 1317   Session End time: 1349  Total time in minutes: 32   Referring Provider: Federico Flake, MD Patient/Family location: Home [facility] Provider location: Center for [facility]  All persons participating in visit: Patient Courtney Burnett and [facility] Keaghan Staton   Types of Service: Individual psychotherapy and Telephone visit  I connected with Betsey Holiday and/or Clarita Crane Woodrum's  n/a  via  Telephone or Video Enabled Telemedicine Application  (Video is Caregility application) and verified that I am speaking with the correct person using two identifiers. Discussed confidentiality: Yes   I discussed the limitations of telemedicine and the availability of in person appointments.  Discussed there is a possibility of technology failure and discussed alternative modes of communication if that failure occurs.  I discussed that engaging in this telemedicine visit, they consent to the provision of behavioral healthcare and the services will be billed under their insurance.  Patient and/or legal guardian expressed understanding and consented to Telemedicine visit: Yes   Presenting Concerns: Patient and/or family reports the following symptoms/concerns: Increase in anxiety,worry, depression and panic attacks postpartum; not noticing any changes after increased Zoloft at 150 mg/day.  Duration of problem: increase postpartum; Severity of problem:  moderately severe  Patient and/or Family's Strengths/Protective Factors: Social connections, Concrete supports in place (healthy food, safe environments, etc.), Sense of purpose, and Physical Health (exercise, healthy diet, medication compliance, etc.)  Goals Addressed: Patient will:  Reduce symptoms of: anxiety and depression    Increase knowledge and/or ability of: self-management skills   Demonstrate ability to: Increase healthy adjustment to current life circumstances  Progress towards Goals: Ongoing  Interventions: Interventions utilized:  Mindfulness or Management consultant, Psychoeducation and/or Health Education, and Link to [employer] Standardized Assessments completed: GAD-7 and PHQ 9  Patient and/or Family Response: Patient agrees with treatment plan.   Assessment: Patient currently experiencing Adjustment disorder with mixed anxious and depressed mood.   Patient may benefit from psychoeducation and brief therapeutic interventions regarding coping with symptoms of depression, anxiety with panic .  Plan: Follow up with behavioral health clinician on : Two weeks Behavioral recommendations:  -Continue taking Zoloft as prescribed -CALM relaxation breathing exercise twice daily (morning; at bedtime); as needed throughout the day. --Continue prioritizing healthy self-care (regular meals, adequate rest; allowing practical help from supportive friends and family) until at least postpartum medical appointment -Consider new mom support group as needed at either [email or web address].net or [email or web address]   Referral(s): Integrated Art gallery manager (In Clinic) and [employer]:  new parent support  I discussed the assessment and treatment plan with the patient and/or parent/guardian. They were provided an opportunity to ask questions and all were answered. They agreed with the plan and demonstrated an understanding of the instructions.   They were advised to call back or seek an in-person evaluation if the symptoms worsen or if the condition fails to improve as anticipated.  [name], LCSW       09/19/2021   10:11 AM 08/07/2017    8:24 AM 06/22/2017    9:15 AM  Edinburgh Postnatal Depression Scale Screening Tool  I have been able to laugh and see the funny side of  things. 0 2 0  I have looked forward with enjoyment to things. 1 1 1   I have blamed myself unnecessarily  when things went wrong. 2 1 1   I have been anxious or worried for no good reason. 2 3 3   I have felt scared or panicky for no good reason. 2 3 1   Things have been getting on top of me. 2 2 0  I have been so unhappy that I have had difficulty sleeping. 1 3 1   I have felt sad or miserable. 1 2 1   I have been so unhappy that I have been crying. 1 1 1   The thought of harming myself has occurred to me. 0 0 0  Edinburgh Postnatal Depression Scale Total 12 18 9        10/10/2021    1:21 PM 06/09/2021   10:28 AM 05/10/2021   11:47 AM 04/25/2021    1:32 PM 02/18/2021    4:02 PM  Depression screen PHQ 2/9  Decreased Interest 2 1 1 1  0  Down, Depressed, Hopeless 3 1 1 1  0  PHQ - 2 Score 5 2 2 2  0  Altered sleeping 3 1 1 1    Tired, decreased energy 3 2 2 1    Change in appetite 3 1 0 1   Feeling bad or failure about yourself  0 0 0 0   Trouble concentrating 3 1 1  0   Moving slowly or fidgety/restless 1 0 0 3   Suicidal thoughts 0 0 0 0   PHQ-9 Score 18 7 6 8    Difficult doing work/chores    Somewhat difficult       10/10/2021    1:23 PM 06/09/2021   10:28 AM 05/10/2021   11:47 AM 09/14/2017    2:10 PM  GAD 7 : Generalized Anxiety Score  Nervous, Anxious, on Edge 3 2 2 3   Control/stop worrying 3 3 2 3   Worry too much - different things 3 3 2 3   Trouble relaxing 3 3 2 3   Restless 1 3 2 3   Easily annoyed or irritable 1 3 2 3   Afraid - awful might happen 1 2 1 3   Total GAD 7 Score 15 19 13 21   Anxiety Difficulty    Very difficult

## 2021-09-28 ENCOUNTER — Telehealth: Payer: Self-pay

## 2021-09-28 NOTE — Telephone Encounter (Signed)
Left vm for pt to call office back to get scheduled for a blood pressure check per secure chat.

## 2021-09-29 ENCOUNTER — Encounter: Payer: Medicaid Other | Admitting: Obstetrics and Gynecology

## 2021-10-04 ENCOUNTER — Ambulatory Visit (INDEPENDENT_AMBULATORY_CARE_PROVIDER_SITE_OTHER): Payer: Medicaid Other | Admitting: Obstetrics and Gynecology

## 2021-10-04 ENCOUNTER — Inpatient Hospital Stay (HOSPITAL_BASED_OUTPATIENT_CLINIC_OR_DEPARTMENT_OTHER): Payer: Medicaid Other

## 2021-10-04 ENCOUNTER — Inpatient Hospital Stay (HOSPITAL_COMMUNITY): Payer: Medicaid Other

## 2021-10-04 ENCOUNTER — Encounter (HOSPITAL_COMMUNITY): Payer: Self-pay | Admitting: Obstetrics and Gynecology

## 2021-10-04 ENCOUNTER — Inpatient Hospital Stay (HOSPITAL_COMMUNITY)
Admission: AD | Admit: 2021-10-04 | Discharge: 2021-10-04 | Disposition: A | Discharge status: Home / Self Care | Payer: Medicaid Other | Attending: Obstetrics and Gynecology | Admitting: Obstetrics and Gynecology

## 2021-10-04 VITALS — BP 136/84 | HR 98 | Temp 98.2°F | Resp 18

## 2021-10-04 DIAGNOSIS — B9789 Other viral agents as the cause of diseases classified elsewhere: Secondary | ICD-10-CM | POA: Diagnosis not present

## 2021-10-04 DIAGNOSIS — O99893 Other specified diseases and conditions complicating puerperium: Secondary | ICD-10-CM | POA: Insufficient documentation

## 2021-10-04 DIAGNOSIS — O1205 Gestational edema, complicating the puerperium: Secondary | ICD-10-CM

## 2021-10-04 DIAGNOSIS — R079 Chest pain, unspecified: Secondary | ICD-10-CM | POA: Diagnosis not present

## 2021-10-04 DIAGNOSIS — Z20822 Contact with and (suspected) exposure to covid-19: Secondary | ICD-10-CM | POA: Insufficient documentation

## 2021-10-04 DIAGNOSIS — R609 Edema, unspecified: Secondary | ICD-10-CM

## 2021-10-04 DIAGNOSIS — O10919 Unspecified pre-existing hypertension complicating pregnancy, unspecified trimester: Secondary | ICD-10-CM

## 2021-10-04 DIAGNOSIS — J988 Other specified respiratory disorders: Secondary | ICD-10-CM | POA: Diagnosis not present

## 2021-10-04 DIAGNOSIS — R059 Cough, unspecified: Secondary | ICD-10-CM | POA: Diagnosis not present

## 2021-10-04 DIAGNOSIS — R600 Localized edema: Secondary | ICD-10-CM | POA: Diagnosis not present

## 2021-10-04 DIAGNOSIS — O1093 Unspecified pre-existing hypertension complicating the puerperium: Secondary | ICD-10-CM | POA: Insufficient documentation

## 2021-10-04 DIAGNOSIS — J40 Bronchitis, not specified as acute or chronic: Secondary | ICD-10-CM | POA: Diagnosis not present

## 2021-10-04 LAB — COMPREHENSIVE METABOLIC PANEL
ALT: 32 U/L (ref 0–44)
AST: 24 U/L (ref 15–41)
Albumin: 3.6 g/dL (ref 3.5–5.0)
Alkaline Phosphatase: 94 U/L (ref 38–126)
Anion gap: 10 (ref 5–15)
BUN: 12 mg/dL (ref 6–20)
CO2: 26 mmol/L (ref 22–32)
Calcium: 9.2 mg/dL (ref 8.9–10.3)
Chloride: 103 mmol/L (ref 98–111)
Creatinine, Ser: 0.65 mg/dL (ref 0.44–1.00)
GFR, Estimated: >60 mL/min (ref >60)
Glucose, Bld: 84 mg/dL (ref 70–99)
Potassium: 4.6 mmol/L (ref 3.5–5.1)
Sodium: 139 mmol/L (ref 135–145)
Total Bilirubin: 0.4 mg/dL (ref 0.3–1.2)
Total Protein: 6.9 g/dL (ref 6.5–8.1)

## 2021-10-04 LAB — CBC
HCT: 34.4 % — ABNORMAL LOW (ref 36.0–46.0)
Hemoglobin: 12.8 g/dL (ref 12.0–15.0)
MCH: 33.5 pg (ref 26.0–34.0)
MCHC: 37.2 g/dL — ABNORMAL HIGH (ref 30.0–36.0)
MCV: 90.1 fL (ref 80.0–100.0)
Platelets: 463 10*3/uL — ABNORMAL HIGH (ref 150–400)
RBC: 3.82 MIL/uL — ABNORMAL LOW (ref 3.87–5.11)
RDW: 15 % (ref 11.5–15.5)
WBC: 7.6 10*3/uL (ref 4.0–10.5)
nRBC: 0 % (ref 0.0–0.2)

## 2021-10-04 LAB — URINALYSIS, ROUTINE W REFLEX MICROSCOPIC
Bilirubin Urine: NEGATIVE
Glucose, UA: NEGATIVE mg/dL
Hgb urine dipstick: NEGATIVE
Ketones, ur: NEGATIVE mg/dL
Leukocytes,Ua: NEGATIVE
Nitrite: NEGATIVE
Protein, ur: NEGATIVE mg/dL
Specific Gravity, Urine: 1.019 (ref 1.005–1.030)
pH: 6 (ref 5.0–8.0)

## 2021-10-04 LAB — TROPONIN I (HIGH SENSITIVITY): Troponin I (High Sensitivity): 5 ng/L (ref <18)

## 2021-10-04 LAB — RESP PANEL BY RT-PCR (FLU A&B, COVID) ARPGX2
Influenza A by PCR: NEGATIVE
Influenza B by PCR: NEGATIVE
SARS Coronavirus 2 by RT PCR: NEGATIVE

## 2021-10-04 LAB — BRAIN NATRIURETIC PEPTIDE: B Natriuretic Peptide: 2 pg/mL (ref 0.0–100.0)

## 2021-10-04 MED ORDER — ALUM & MAG HYDROXIDE-SIMETH 200-200-20 MG/5ML PO SUSP
30.0000 mL | Freq: Once | ORAL | Status: AC
  Administered 2021-10-04: 30 mL via ORAL
  Filled 2021-10-04: qty 30

## 2021-10-04 MED ORDER — ALBUTEROL SULFATE HFA 108 (90 BASE) MCG/ACT IN AERS
2.0000 | INHALATION_SPRAY | Freq: Once | RESPIRATORY_TRACT | Status: AC
  Administered 2021-10-04: 2 via RESPIRATORY_TRACT
  Filled 2021-10-04: qty 6.7

## 2021-10-04 MED ORDER — LIDOCAINE VISCOUS HCL 2 % MT SOLN
15.0000 mL | Freq: Once | OROMUCOSAL | Status: AC
  Administered 2021-10-04: 15 mL via ORAL
  Filled 2021-10-04: qty 15

## 2021-10-04 NOTE — Progress Notes (Signed)
OB note  Patient here for RN BP check. BP normal and VS normal and stable, including temp. She states that she's had a worsening cough since her scheduled rpt c/s on 8/6 mildly productive of normal phlegm and some feverish s/s at home. No sick contacts at home. She says she does have asthma with only albuterol usage (no maintenance inhaler) which she has had to use more of.   She states she took an at home covid test which was negative. She also endorses b/l LE edema  Vitals:   10/04/21 1334  BP: 136/84  Pulse: 98  Resp: 18  Temp: 98.2 F (36.8 C)  TempSrc: Oral  SpO2: 94%   NAD with mild to moderate cough (somewhat wet) No respiratory distress. +mild b/l wheezes in the lower to mid lower areas O2 sat obtained and she is 94% on RA  Urgent care called and they said that they would send her to the ED since she has some chest discomfort  Recommend she go to MAU to eval for HAP, bronichitis, asthma exacerbation etc  BP normal so recommend staying on the bid procardia  D/w MAU  Cornelia Copa MD Attending Center for [facility] (Faculty Practice) 10/04/2021 Time: [phone]

## 2021-10-04 NOTE — Discharge Instructions (Signed)
Use albuterol inhaler, 2 puffs every 4 hours for the next 48 hours Take delsym & mucinex for symptoms

## 2021-10-04 NOTE — MAU Note (Signed)
.  Courtney Burnett is a 31 y.o. at Unknown here in MAU reporting: she has had a cough since C/S delivery on 8/6 that has gotten worse over the last 4 days. She reports a productive cough with yellow sputum. She also has SOB when at rest. She has noted chills and chest pain (6/10) that she describes as "Feels like something is moving" that has been intermittent. She has also had BLE edema and burning pain (8/10) since delivering and received Lasix postpartum. She reports taking a COVID test two days ago that was negative.   Onset of complaint: 2 weeks Pain score: 8/10 Vitals:   10/04/21 1442 10/04/21 1445  BP:  (!) 140/86  Pulse:  88  Resp:  (!) 22  Temp:  98.3 F (36.8 C)  SpO2: 97%      FHT:N/A Lab orders placed from triage:UA
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
## 2021-10-04 NOTE — Progress Notes (Signed)
Bilateral LE venous duplex study completed. Please see CV Proc for preliminary results.  Magdalene River BS, RVT 10/04/2021 3:42 PM

## 2021-10-04 NOTE — Progress Notes (Signed)
Subjective:  Courtney Burnett is a 31 y.o. female here for BP check.   Hypertension ROS: Patient c/o headaches, swelling ,cough w/chest pain denies visual symptoms, RUQ/epigastric pain or other concerning symptoms.  Objective:  There were no vitals taken for this visit.  Appearance Swelling in feet. General exam BP noted to be 136/84 today in office.    Assessment:   Blood Pressure stable.   Plan:  Discussed patients symptoms with provider in the office to advise.  Marland Kitchen

## 2021-10-06 ENCOUNTER — Encounter: Payer: Medicaid Other | Admitting: Advanced Practice Midwife

## 2021-10-10 ENCOUNTER — Ambulatory Visit (INDEPENDENT_AMBULATORY_CARE_PROVIDER_SITE_OTHER): Payer: Medicaid Other | Admitting: Clinical

## 2021-10-10 DIAGNOSIS — F4323 Adjustment disorder with mixed anxiety and depressed mood: Secondary | ICD-10-CM | POA: Diagnosis not present

## 2021-10-10 NOTE — Patient Instructions (Addendum)
[facility] [address] [place], [place] [address] [phone] (main office) [phone] [facility] office)  New Parent Support Groups www.postpartum.net www.conehealthybaby.com  /Emotional Wellbeing Apps and Websites Here are a few free apps meant to help you to help yourself.  To find, try searching on the internet to see if the app is offered on Apple/Android devices. If your first choice doesn't come up on your device, the good news is that there are many choices! Play around with different apps to see which ones are helpful to you.    Calm This is an app meant to help increase calm feelings. Includes info, strategies, and tools for tracking your feelings.      Calm Harm  This app is meant to help with self-harm. Provides many 5-minute or 15-min coping strategies for doing instead of hurting yourself.       Healthy Minds Health Minds is a problem-solving tool to help deal with emotions and cope with stress you encounter wherever you are.      MindShift This app can help people cope with anxiety. Rather than trying to avoid anxiety, you can make an important shift and face it.      MY3  MY3 features a support system, safety plan and resources with the goal of offering a tool to use in a time of need.       My Life My Voice  This mood journal offers a simple solution for tracking your thoughts, feelings and moods. Animated emoticons can help identify your mood.       Relax Melodies Designed to help with sleep, on this app you can mix sounds and meditations for relaxation.      Smiling Mind Smiling Mind is meditation made easy: it's a simple tool that helps put a smile on your mind.        Stop, Breathe & Think  A friendly, simple guide for people through meditations for mindfulness and compassion.  Stop, Breathe and Think Kids Enter your current feelings and choose a "mission" to help you cope. Offers  videos for certain moods instead of just sound recordings.       Team Orange The goal of this tool is to help teens change how they think, act, and react. This app helps you focus on your own good feelings and experiences.      The [employer] Box The [employer] Box (VHB) contains simple tools to help patients with coping, relaxation, distraction, and positive thinking.    Coping with Panic Attacks   What is a panic attack?  You may have had a panic attack if you experienced four or more of the symptoms listed below coming on abruptly and peaking in about 10 minutes.  Panic Symptoms    Pounding heart   Sweating   Trembling or shaking   Shortness of breath   Feeling of choking   Chest pain   Nausea or abdominal distress     Feeling dizzy, unsteady, lightheaded, or faint   Feelings of unreality or being detached from yourself   Fear of losing control or going crazy   Fear of dying   Numbness or tingling   Chills or hot flashes      Panic attacks are sometimes accompanied by avoidance of certain places or situations. These are often situations that would be difficult to escape from or in which help might not be available. Examples might include crowded shopping malls, public  transportation, restaurants, or driving.   Why do panic attacks occur?   Panic attacks are the body's alarm system gone awry. All of [place] have a built-in alarm system, powered by adrenaline, which increases our heart rate, breathing, and blood flow in response to danger. Ordinarily, this 'danger response system' works well. In some people, however, the response is either out of proportion to whatever stress is going on, or may come out of the blue without any stress at all.   For example, if you are walking in the woods and see a bear coming your way, a variety of changes occur in your body to prepare you to either fight the danger or flee from the situation. Your heart rate will increase to get more blood  flow around your body, your breathing rate will quicken so that more oxygen is available, and your muscles will tighten in order to be ready to fight or run. You may feel nauseated as blood flow leaves your stomach area and moves into your limbs. These bodily changes are all essential to helping you survive the dangerous situation. After the danger has passed, your body functions will begin to go back to normal. This is because your body also has a system for "recovering" by bringing your body back down to a normal state when the danger is over.   As you can see, the emergency response system is adaptive when there is, in fact, a "true" or "real" danger (e.g., bear). However, sometimes people find that their emergency response system is triggered in "everyday" situations where there really is no true physical danger (e.g., in a meeting, in the grocery store, while driving in normal traffic, etc.).   What triggers a panic attack?  Sometimes particularly stressful situations can trigger a panic attack. For example, an argument with your spouse or stressors at work can cause a stress response (activating the emergency response system) because you perceive it as threatening or overwhelming, even if there is no direct risk to your survival.  Sometimes panic attacks don't seem to be triggered by anything in particular- they may "come out of the blue". Somehow, the natural "fight or flight" emergency response system has gotten activated when there is no real danger. Why does the body go into "emergency mode" when there is no real danger?   Often, people with panic attacks are frightened or alarmed by the physical sensations of the emergency response system. First, unexpected physical sensations are experienced (tightness in your chest or some shortness of breath). This then leads to feeling fearful or alarmed by these symptoms ("Something's wrong!", "Am I having a heart attack?", "Am I going to faint?") The mind  perceives that there is a danger even though no real danger exists. This, in turn, activates the emergency response system ("fight or flight"), leading to a "full blown" panic attack. In summary, panic attacks occur when we misinterpret physical symptoms as signs of impending death, craziness, loss of control, embarrassment, or fear of fear. Sometimes you may be aware of thoughts of danger that activate the emergency response system (for example, thinking "I'm having a heart attack" when you feel chest pressure or increased heart rate). At other times, however, you may not be aware of such thoughts. After several incidences of being afraid of physical sensations, anxiety and panic can occur in response to the initial sensations without conscious thoughts of danger. Instead, you just feel afraid or alarmed. In other words, the panic or fear may seem to  occur "automatically" without you consciously telling yourself anything.   After having had one or more panic attacks, you may also become more focused on what is going on inside your body. You may scan your body and be more vigilant about noticing any symptoms that might signal the start of a panic attack. This makes it easier for panic attacks to happen again because you pick up on sensations you might otherwise not have noticed, and misinterpret them as something dangerous. A panic attack may then result.      How do I cope with panic attacks?  An important part of overcoming panic attacks involves re-interpreting your body's physical reactions and teaching yourself ways to decrease the physical arousal. This can be done through practicing the cognitive and behavioral interventions below.   Research has found that over half of people who have panic attacks show some signs of hyperventilation or overbreathing. This can produce initial sensations that alarm you and lead to a panic attack. Overbreathing can also develop as part of the panic attack and make  the symptoms worse. When people hyperventilate, certain blood vessels in the body become narrower. In particular, the brain may get slightly less oxygen. This can lead to the symptoms of dizziness, confusion, and lightheadedness that often occur during panic attacks. Other parts of the body may also get a bit less oxygen, which may lead to numbness or tingling in the hands or feet or the sensation of cold, clammy hands. It also may lead the heart to pump harder. Although these symptoms may be frightening and feel unpleasant, it is important to remember that hyperventilating is not dangerous. However, you can help overcome the unpleasantness of overbreathing by practicing Breathing Retraining.   Practice this basic technique three times a day, every day:   Inhale. With your shoulders relaxed, inhale as slowly and deeply as you can while you count to six. If you can, use your diaphragm to fill your lungs with air.   Hold. Keep the air in your lungs as you slowly count to four.   Exhale. Slowly breath out as you count to six.   Repeat. Do the inhale-hold-exhale cycle several times. Each time you do it, exhale for longer counts.  Like any new skill, Breathing Retraining requires practice. Try practicing this skill twice a day for several minutes. Initially, do not try this technique in specific situations or when you become frightened or have a panic attack. Begin by practicing in a quiet environment to build up your skill level so that you can later use it in time of "emergency."   2. Decreasing Avoidance  Regardless of whether you can identify why you began having panic attacks or whether they seemed to come out of the blue, the places where you began having panic attacks often can become triggers themselves. It is not uncommon for individuals to begin to avoid the places where they have had panic attacks. Over time, the individual may begin to avoid more and more places, thereby decreasing their activities  and often negatively impacting their quality of life. To break the cycle of avoidance, it is important to first identify the places or situations that are being avoided, and then to do some "relearning."  To begin this intervention, first create a list of locations or situations that you tend to avoid. Then choose an avoided location or situation that you would like to target first. Now develop an "exposure hierarchy" for this situation or location. An "exposure hierarchy"  is a list of actions that make you feel anxious in this situation. Order these actions from least to most anxiety-producing. It is often helpful to have the first item on your hierarchy involve thinking or imagining part of the feared/avoided situation.   Here is an example of an exposure hierarchy for decreasing avoidance of the grocery store. Note how it is ordered from the least amount of anxiety (at the top) to the most anxiety (at the bottom):   Think about going to the grocery store alone.   Go to the grocery store with a friend or family member.   Go to the grocery store alone to pick up a few small items (5-10 minutes in the store).   Shopping for 10-20 minutes in the store alone.   Doing the shopping for the week by myself (20-30 minutes in the store).   Your homework is to "expose" yourself to the lowest item on your hierarchy and use your breathing relaxation and coping statements (see below) to help you remain in the situation. Practice this several times during the upcoming week. Once you have mastered each item with minimal anxiety, move on to the next higher action on your list.   Cognitive Interventions  Identify your negative self-talk Anxious thoughts can increase anxiety symptoms and panic. The first step in changing anxious thinking is to identify your own negative, alarming self-talk. Some common alarming thoughts:  I'm having a heart attack.            I must be going crazy. I think I'm dying. People will  think I'm crazy. I'm going to pass our.  Oh no- here it comes.  I can't stand this.  I've got to get out of here!  2. Use positive coping statements Changing or disrupting a pattern of anxious thoughts by replacing them with more calming or supportive statements can help to divert a panic attack. Some common helpful coping statements:  This is not an emergency.  I don't like feeling this way, but I can accept it.  I can feel like this and still be okay.  This has happened before, and I was okay. I'll be okay this time, too.  I can be anxious and still deal with this situation.

## 2021-10-11 NOTE — BH Specialist Note (Signed)
Pt did not arrive to video visit and did not answer the phone; Left HIPPA-compliant message to call back Perlie Scheuring from [facility] at  [phone] (Carlitos Bottino's office).  ?; left MyChart message for patient.  ? ?

## 2021-10-12 ENCOUNTER — Encounter: Payer: Self-pay | Admitting: *Deleted

## 2021-10-24 ENCOUNTER — Ambulatory Visit: Payer: Medicaid Other | Admitting: Clinical

## 2021-10-24 DIAGNOSIS — Z91199 Patient's noncompliance with other medical treatment and regimen due to unspecified reason: Secondary | ICD-10-CM

## 2021-10-27 DIAGNOSIS — H5213 Myopia, bilateral: Secondary | ICD-10-CM | POA: Diagnosis not present

## 2021-10-29 ENCOUNTER — Other Ambulatory Visit: Payer: Self-pay | Admitting: Family Medicine

## 2021-10-29 DIAGNOSIS — O10919 Unspecified pre-existing hypertension complicating pregnancy, unspecified trimester: Secondary | ICD-10-CM

## 2021-11-02 ENCOUNTER — Encounter: Payer: Self-pay | Admitting: Obstetrics and Gynecology

## 2021-11-02 ENCOUNTER — Ambulatory Visit (INDEPENDENT_AMBULATORY_CARE_PROVIDER_SITE_OTHER): Payer: Medicaid Other | Admitting: Obstetrics and Gynecology

## 2021-11-02 ENCOUNTER — Other Ambulatory Visit: Payer: Medicaid Other

## 2021-11-02 VITALS — BP 115/80 | HR 80 | Wt 226.0 lb

## 2021-11-02 DIAGNOSIS — O10919 Unspecified pre-existing hypertension complicating pregnancy, unspecified trimester: Secondary | ICD-10-CM

## 2021-11-02 DIAGNOSIS — Z758 Other problems related to medical facilities and other health care: Secondary | ICD-10-CM

## 2021-11-02 DIAGNOSIS — O1093 Unspecified pre-existing hypertension complicating the puerperium: Secondary | ICD-10-CM

## 2021-11-02 DIAGNOSIS — E119 Type 2 diabetes mellitus without complications: Secondary | ICD-10-CM

## 2021-11-02 DIAGNOSIS — I10 Essential (primary) hypertension: Secondary | ICD-10-CM

## 2021-11-02 MED ORDER — NIFEDIPINE ER OSMOTIC RELEASE 60 MG PO TB24: 60.0000 mg | ORAL_TABLET | Freq: Every day | ORAL | 1 refills | Status: AC

## 2021-11-02 NOTE — Progress Notes (Signed)
    [facility] Partum Visit Note  Courtney Burnett is a 31 y.o. [phone] female who presents for a postpartum visit. She is 6 weeks postpartum following a scheduled repeat cesarean section and BTL at 36/6 weeks for a h/o classical c-section.  I have fully reviewed the prenatal and intrapartum course. Anesthesia: spinal. Postpartum course has been unremarkable. Baby is doing well. Baby is feeding by bottle - Gerber Gentle ; patient just stopped breastfeeding Bleeding Spotting. Bowel function is normal. Bladder function is normal. Patient is not sexually active. Postpartum depression screening: positive and she is being followed by Roselyn Reef at the [facility] already.   Edinburgh Postnatal Depression Scale - 11/02/21 0828       Edinburgh Postnatal Depression Scale:  In the Past 7 Days   I have been able to laugh and see the funny side of things. 1    I have looked forward with enjoyment to things. 1    I have blamed myself unnecessarily when things went wrong. 2    I have been anxious or worried for no good reason. 3    I have felt scared or panicky for no good reason. 3    Things have been getting on top of me. 2    I have been so unhappy that I have had difficulty sleeping. 1    I have felt sad or miserable. 2    I have been so unhappy that I have been crying. 2    The thought of harming myself has occurred to me. 0    Edinburgh Postnatal Depression Scale Total 17            Pap and hpv neg 03/2021  Review of Systems Pertinent items noted in HPI and remainder of comprehensive ROS otherwise negative.  Objective:  BP 115/80   Pulse 80   Wt 226 lb (102.5 kg)   Breastfeeding No   BMI 38.79 kg/m    NAD Abdomen: soft, nttp, nd. C/d/I incision with small pinpoint area a few cm from the left lateral edge that has slight skin separation, non weeping, nttp.  Assessment:   Normal postpartum exam.   Plan:   *HTN: patient confirms on procardia 61 qday which she took this morning. Continue on this  since she had elevated BPs at her new OB. She doesn't have a PCP so referral made for her *DM2: on metformin 750 bid, she isn't checking sugars. I told her I recommend checking sugars at home, although doesn't need to do it as frequently since not pregnancy anymore. I told her I recommend qam and random before meal sugars *PP: routine care. Incision healing well  RTC PRN  Aletha Halim, MD [facility]

## 2021-11-03 ENCOUNTER — Other Ambulatory Visit: Payer: Medicaid Other

## 2021-11-12 ENCOUNTER — Other Ambulatory Visit: Payer: Self-pay | Admitting: Family Medicine

## 2021-12-26 ENCOUNTER — Other Ambulatory Visit: Payer: Self-pay | Admitting: Family Medicine

## 2021-12-26 DIAGNOSIS — O10919 Unspecified pre-existing hypertension complicating pregnancy, unspecified trimester: Secondary | ICD-10-CM

## 2022-03-22 ENCOUNTER — Ambulatory Visit
Admission: RE | Admit: 2022-03-22 | Discharge: 2022-03-22 | Disposition: A | Discharge status: Home / Self Care | Payer: Medicaid Other | Source: Ambulatory Visit | Attending: Family | Admitting: Family

## 2022-03-22 ENCOUNTER — Other Ambulatory Visit: Payer: Self-pay | Admitting: Family

## 2022-03-22 DIAGNOSIS — M25572 Pain in left ankle and joints of left foot: Secondary | ICD-10-CM | POA: Insufficient documentation

## 2022-03-22 DIAGNOSIS — S93402A Sprain of unspecified ligament of left ankle, initial encounter: Secondary | ICD-10-CM | POA: Diagnosis not present

## 2022-08-18 ENCOUNTER — Other Ambulatory Visit: Payer: Self-pay | Admitting: Obstetrics and Gynecology

## 2022-08-18 DIAGNOSIS — O10919 Unspecified pre-existing hypertension complicating pregnancy, unspecified trimester: Secondary | ICD-10-CM

## 2023-11-13 DIAGNOSIS — R112 Nausea with vomiting, unspecified: Secondary | ICD-10-CM | POA: Diagnosis not present

## 2023-11-13 DIAGNOSIS — K439 Ventral hernia without obstruction or gangrene: Secondary | ICD-10-CM | POA: Diagnosis not present

## 2023-11-13 DIAGNOSIS — I10 Essential (primary) hypertension: Secondary | ICD-10-CM | POA: Diagnosis not present

## 2023-11-13 DIAGNOSIS — K760 Fatty (change of) liver, not elsewhere classified: Secondary | ICD-10-CM | POA: Diagnosis not present
